# Patient Record
Sex: Female | Born: 1995 | Hispanic: Yes | Marital: Married | Smoking: Never smoker
Health system: Southern US, Community
[De-identification: ages and names within clinical notes are randomized; demographics above are authoritative.]

## PROBLEM LIST (undated history)

## (undated) ENCOUNTER — Inpatient Hospital Stay (HOSPITAL_COMMUNITY): Payer: Self-pay

## (undated) DIAGNOSIS — Z789 Other specified health status: Secondary | ICD-10-CM

## (undated) DIAGNOSIS — Z8759 Personal history of other complications of pregnancy, childbirth and the puerperium: Secondary | ICD-10-CM

## (undated) HISTORY — DX: Other specified health status: Z78.9

## (undated) HISTORY — DX: Personal history of other complications of pregnancy, childbirth and the puerperium: Z87.59

---
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
## 2018-06-21 NOTE — L&D Delivery Note (Signed)
   Delivery Note Pt progressed wo augmentation to C/C?+2.  After a brief 2nd stage, at 7:06 AM a viable female was delivered via Vaginal, Spontaneous (Presentation: ROA ).  APGAR: 7, 9; weight pending.  After 1 minute, the cord was clamped and cut. 40 units of pitocin diluted in 1000cc LR was infused rapidly IV.  The umbilical cord started tearing after 30 minutes, so it was manually removed.with very little effort. It was inspected and appears to be intact with a 3 VC.  Anesthesia: none   Episiotomy: None Lacerations: None Suture Repair:  Est. Blood Loss (mL):  200  Mom to postpartum.  Baby to Couplet care / Skin to Skin.   THe above was performed under my direct supervision and guidance by Luevenia Maxin, DO  Christin Fudge 03/30/2019, 7:44 AM

## 2018-08-29 ENCOUNTER — Inpatient Hospital Stay (HOSPITAL_COMMUNITY): Payer: Self-pay

## 2018-08-29 ENCOUNTER — Inpatient Hospital Stay (HOSPITAL_COMMUNITY)
Admission: AD | Admit: 2018-08-29 | Discharge: 2018-08-29 | Disposition: A | Discharge status: Home / Self Care | Payer: Self-pay | Attending: Family Medicine | Admitting: Family Medicine

## 2018-08-29 ENCOUNTER — Encounter (HOSPITAL_COMMUNITY): Payer: Self-pay | Admitting: *Deleted

## 2018-08-29 DIAGNOSIS — O208 Other hemorrhage in early pregnancy: Secondary | ICD-10-CM | POA: Insufficient documentation

## 2018-08-29 DIAGNOSIS — O26891 Other specified pregnancy related conditions, first trimester: Secondary | ICD-10-CM | POA: Insufficient documentation

## 2018-08-29 DIAGNOSIS — R109 Unspecified abdominal pain: Secondary | ICD-10-CM | POA: Insufficient documentation

## 2018-08-29 DIAGNOSIS — O209 Hemorrhage in early pregnancy, unspecified: Secondary | ICD-10-CM

## 2018-08-29 DIAGNOSIS — Z6740 Type O blood, Rh positive: Secondary | ICD-10-CM

## 2018-08-29 DIAGNOSIS — Z3490 Encounter for supervision of normal pregnancy, unspecified, unspecified trimester: Secondary | ICD-10-CM

## 2018-08-29 DIAGNOSIS — O360910 Maternal care for other rhesus isoimmunization, first trimester, not applicable or unspecified: Secondary | ICD-10-CM

## 2018-08-29 DIAGNOSIS — Z3A08 8 weeks gestation of pregnancy: Secondary | ICD-10-CM | POA: Insufficient documentation

## 2018-08-29 LAB — ABO/RH: ABO/RH(D): O POS

## 2018-08-29 LAB — URINALYSIS, ROUTINE W REFLEX MICROSCOPIC
BILIRUBIN URINE: NEGATIVE
Glucose, UA: NEGATIVE mg/dL
Ketones, ur: NEGATIVE mg/dL
Nitrite: NEGATIVE
PROTEIN: NEGATIVE mg/dL
Specific Gravity, Urine: 1.012 (ref 1.005–1.030)
pH: 6 (ref 5.0–8.0)

## 2018-08-29 LAB — CBC
HCT: 38.7 % (ref 36.0–46.0)
Hemoglobin: 12.9 g/dL (ref 12.0–15.0)
MCH: 28.4 pg (ref 26.0–34.0)
MCHC: 33.3 g/dL (ref 30.0–36.0)
MCV: 85.2 fL (ref 80.0–100.0)
Platelets: 275 10*3/uL (ref 150–400)
RBC: 4.54 MIL/uL (ref 3.87–5.11)
RDW: 12.5 % (ref 11.5–15.5)
WBC: 7.1 10*3/uL (ref 4.0–10.5)
nRBC: 0 % (ref 0.0–0.2)

## 2018-08-29 LAB — HCG, QUANTITATIVE, PREGNANCY: HCG, BETA CHAIN, QUANT, S: 68307 m[IU]/mL — AB (ref <5)

## 2018-08-29 MED ORDER — PROMETHAZINE HCL 25 MG PO TABS: 12.5000 mg | ORAL_TABLET | Freq: Four times a day (QID) | ORAL | 0 refills | Status: DC | PRN

## 2018-08-29 NOTE — MAU Note (Signed)
USING INTERPRETER- [employer] - [phone]   .   SAYS LMP WAS 07-03-18. NO BIRTH CONTROL. LAST SEX- Sunday.    HPT DONE - 08-10-18-POSITIVE.   BLEEDING/ CRAMPING   STARTED AT 600PM- TONIGHT - PINK- WHEN SHE WIPES AND 1 SPOT ON UNDERWEAR.

## 2018-08-29 NOTE — MAU Provider Note (Signed)
History     CSN: [id number]  Arrival date and time: 08/29/18 1845   None     Chief Complaint  Patient presents with  . Abdominal Pain  . Vaginal Bleeding   Amy Prince is a 23 y.o. G2P0 at [redacted]w[redacted]d who presents today with cramping and spotting.   Vaginal Bleeding  The patient's primary symptoms include pelvic pain and vaginal bleeding. This is a new problem. The current episode started yesterday. The problem occurs intermittently. The problem has been unchanged. The pain is moderate (4/10). The problem affects both sides. She is pregnant. Associated symptoms include nausea and vomiting. Pertinent negatives include no chills, dysuria, fever, frequency or urgency. The vaginal discharge was bloody. The vaginal bleeding is spotting. She has not been passing clots. She has not been passing tissue. Nothing aggravates the symptoms. She has tried nothing for the symptoms. Her menstrual history has been regular (LMP 07/03/18 ).    OB History    Gravida  2   Para      Term      Preterm      AB      Living  1     SAB      TAB      Ectopic      Multiple      Live Births  1           History reviewed. No pertinent past medical history.  History reviewed. No pertinent surgical history.  History reviewed. No pertinent family history.  Social History   Tobacco Use  . Smoking status: Not on file  Substance Use Topics  . Alcohol use: Not on file  . Drug use: Not on file    Allergies: No Known Allergies  Medications Prior to Admission  Medication Sig Dispense Refill Last Dose  . Prenatal Vit-Fe Fumarate-FA (PRENATAL MULTIVITAMIN) TABS tablet Take 1 tablet by mouth daily at 12 noon.   08/29/2018 at Unknown time    Review of Systems  Constitutional: Negative for chills and fever.  Gastrointestinal: Positive for nausea and vomiting.  Genitourinary: Positive for pelvic pain and vaginal bleeding. Negative for dysuria, frequency and urgency.   Physical Exam   Blood  pressure 103/63, pulse 78, temperature 98.5 F (36.9 C), temperature source Oral, resp. rate 20, weight 100.8 kg, last menstrual period 07/03/2018.  Physical Exam  Nursing note and vitals reviewed. Constitutional: She is oriented to person, place, and time. She appears well-developed and well-nourished. No distress.  HENT:  Head: Normocephalic.  Cardiovascular: Normal rate.  Respiratory: Effort normal.  GI: Soft. There is no abdominal tenderness.  Neurological: She is alert and oriented to person, place, and time.  Skin: Skin is warm and dry.  Psychiatric: She has a normal mood and affect.   Results for orders placed or performed during the hospital encounter of 08/29/18 (from the past 24 hour(s))  Urinalysis, Routine w reflex microscopic     Status: Abnormal   Collection Time: 08/29/18  7:53 PM  Result Value Ref Range   Color, Urine YELLOW YELLOW   APPearance CLOUDY (A) CLEAR   Specific Gravity, Urine 1.012 1.005 - 1.030   pH 6.0 5.0 - 8.0   Glucose, UA NEGATIVE NEGATIVE mg/dL   Hgb urine dipstick LARGE (A) NEGATIVE   Bilirubin Urine NEGATIVE NEGATIVE   Ketones, ur NEGATIVE NEGATIVE mg/dL   Protein, ur NEGATIVE NEGATIVE mg/dL   Nitrite NEGATIVE NEGATIVE   Leukocytes,Ua MODERATE (A) NEGATIVE   RBC / HPF 6-10 0 -  5 RBC/hpf   WBC, UA 21-50 0 - 5 WBC/hpf   Bacteria, UA RARE (A) NONE SEEN   Squamous Epithelial / LPF 11-20 0 - 5  CBC     Status: None   Collection Time: 08/29/18  8:38 PM  Result Value Ref Range   WBC 7.1 4.0 - 10.5 K/uL   RBC 4.54 3.87 - 5.11 MIL/uL   Hemoglobin 12.9 12.0 - 15.0 g/dL   HCT 16.1 09.6 - 04.5 %   MCV 85.2 80.0 - 100.0 fL   MCH 28.4 26.0 - 34.0 pg   MCHC 33.3 30.0 - 36.0 g/dL   RDW 40.9 81.1 - 91.4 %   Platelets 275 150 - 400 K/uL   nRBC 0.0 0.0 - 0.2 %  hCG, quantitative, pregnancy     Status: Abnormal   Collection Time: 08/29/18  8:38 PM  Result Value Ref Range   hCG, Beta Chain, Quant, S 68,307 (H) <5 mIU/mL  ABO/Rh     Status: None    Collection Time: 08/29/18  8:46 PM  Result Value Ref Range   ABO/RH(D) O POS    No rh immune globuloin      NOT A RH IMMUNE GLOBULIN CANDIDATE, PT RH POSITIVE Performed at [facility], [address], [place], [place] [address]    US Ob Comp Less 14 Wks  Result Date: 08/29/2018 CLINICAL DATA:  Vaginal bleeding and cramping. EXAM: OBSTETRIC <14 WK [place] AND TRANSVAGINAL OB US TECHNIQUE: Both transabdominal and transvaginal ultrasound examinations were performed for complete evaluation of the gestation as well as the maternal uterus, adnexal regions, and pelvic cul-de-sac. Transvaginal technique was performed to assess early pregnancy. COMPARISON:  None. FINDINGS: Intrauterine gestational sac: None Yolk sac:  Visualized. Embryo:  Visualized. Cardiac Activity: Visualized. Heart Rate: 174 bpm CRL:  19.4 mm   8 w   3 d                  [place] EDC: 04/06/2019 Subchorionic hemorrhage:  Small, measuring 1.5 by 1.0 by 2.7 cm. Maternal uterus/adnexae: Normal. IMPRESSION: Single live intrauterine pregnancy corresponding to 8 weeks and 3 days gestation. Small subchorionic hemorrhage. Electronically Signed   By: Ted Mcalpine M.D.   On: 08/29/2018 21:12   US Ob Transvaginal  Result Date: 08/29/2018 CLINICAL DATA:  Vaginal bleeding and cramping. EXAM: OBSTETRIC <14 WK [place] AND TRANSVAGINAL OB US TECHNIQUE: Both transabdominal and transvaginal ultrasound examinations were performed for complete evaluation of the gestation as well as the maternal uterus, adnexal regions, and pelvic cul-de-sac. Transvaginal technique was performed to assess early pregnancy. COMPARISON:  None. FINDINGS: Intrauterine gestational sac: None Yolk sac:  Visualized. Embryo:  Visualized. Cardiac Activity: Visualized. Heart Rate: 174 bpm CRL:  19.4 mm   8 w   3 d                  [place] EDC: 04/06/2019 Subchorionic hemorrhage:  Small, measuring 1.5 by 1.0 by 2.7 cm. Maternal uterus/adnexae: Normal. IMPRESSION: Single live intrauterine pregnancy  corresponding to 8 weeks and 3 days gestation. Small subchorionic hemorrhage. Electronically Signed   By: Ted Mcalpine M.D.   On: 08/29/2018 21:12     MAU Course  Procedures  MDM   Assessment and Plan   1. Vaginal bleeding in pregnancy, first trimester   2. [redacted] weeks gestation of pregnancy   3. Intrauterine pregnancy   4. Type O blood, Rh positive    DC home Comfort measures reviewed  1stTrimester precautions  Bleeding precautions RX: phenergan PRN #[id number]  Return to MAU as needed FU with OB as planned  Follow-up Information    Department, [facility] Follow up.   Contact information: [address] [place] [address] [phone]           Thressa Sheller DNP, CNM  08/29/18  9:48 PM

## 2018-08-29 NOTE — Discharge Instructions (Signed)
[facility] Prenatal Care Providers °         °[facility]  ° Phone: [phone] ° °[facility] @ [facility]  ° Phone: [phone] ° °[facility]      ° Phone: [phone]   °         °[facility] @ [facility]    ° Phone: [phone] °         °[facility] @ [facility]  ° Phone: [phone] ° °[facility] @ [facility] ° Phone: [phone] °    °[facility] ° Phone: [phone] ° °

## 2018-08-29 NOTE — MAU Note (Signed)
WENT TO HD ON 08-03-18- POSITIVE UPT.

## 2018-08-30 LAB — RPR: RPR Ser Ql: NONREACTIVE

## 2018-08-30 LAB — HIV ANTIBODY (ROUTINE TESTING W REFLEX): HIV Screen 4th Generation wRfx: NONREACTIVE

## 2018-10-20 LAB — OB RESULTS CONSOLE VARICELLA ZOSTER ANTIBODY, IGG: Varicella: IMMUNE

## 2018-10-20 LAB — CYSTIC FIBROSIS DIAGNOSTIC STUDY: Interpretation-CFDNA:: NEGATIVE

## 2018-10-20 LAB — SICKLE CELL SCREEN: Sickle Cell Screen: NORMAL

## 2018-11-08 ENCOUNTER — Ambulatory Visit (INDEPENDENT_AMBULATORY_CARE_PROVIDER_SITE_OTHER): Payer: Self-pay | Admitting: Obstetrics and Gynecology

## 2018-11-08 ENCOUNTER — Encounter: Payer: Self-pay | Admitting: Obstetrics and Gynecology

## 2018-11-08 ENCOUNTER — Other Ambulatory Visit: Payer: Self-pay

## 2018-11-08 VITALS — BP 102/67 | HR 72 | Temp 98.6°F | Ht 66.0 in | Wt 219.0 lb

## 2018-11-08 DIAGNOSIS — O09899 Supervision of other high risk pregnancies, unspecified trimester: Secondary | ICD-10-CM

## 2018-11-08 DIAGNOSIS — Z3A18 18 weeks gestation of pregnancy: Secondary | ICD-10-CM

## 2018-11-08 DIAGNOSIS — O0990 Supervision of high risk pregnancy, unspecified, unspecified trimester: Secondary | ICD-10-CM

## 2018-11-08 DIAGNOSIS — O09292 Supervision of pregnancy with other poor reproductive or obstetric history, second trimester: Secondary | ICD-10-CM

## 2018-11-08 DIAGNOSIS — O09299 Supervision of pregnancy with other poor reproductive or obstetric history, unspecified trimester: Secondary | ICD-10-CM

## 2018-11-08 DIAGNOSIS — Z789 Other specified health status: Secondary | ICD-10-CM | POA: Insufficient documentation

## 2018-11-08 DIAGNOSIS — O09212 Supervision of pregnancy with history of pre-term labor, second trimester: Secondary | ICD-10-CM

## 2018-11-08 DIAGNOSIS — O0992 Supervision of high risk pregnancy, unspecified, second trimester: Secondary | ICD-10-CM

## 2018-11-08 DIAGNOSIS — Z758 Other problems related to medical facilities and other health care: Secondary | ICD-10-CM

## 2018-11-08 NOTE — Progress Notes (Addendum)
INITIAL PRENATAL VISIT NOTE  Subjective:  Amy Prince is a 23 y.o. G2P1011 at 6070w2d by LMP c/w 8 week US being seen today for her initial prenatal visit. [facility]. This is a planned pregnancy. She and partner are happy with the pregnancy. She was using nothing for birth control previously. She has an obstetric history significant for 1 x SVD @ 36 weeks, induced for severe preeclampsia and SAB at 12 weeks requiring pills to complete. She has a medical history significant for severe preeclampsia.  Patient reports no complaints.  Contractions: Not present. Vag. Bleeding: None.  Movement: Present. Denies leaking of fluid.    History reviewed. No pertinent past medical history.  Past Surgical History:  Procedure Laterality Date  . CHOLECYSTECTOMY OPEN  2013    OB History  Gravida Para Term Preterm AB Living  3 1   1 1 1   SAB TAB Ectopic Multiple Live Births  1       1    # Outcome Date GA Lbr Len/2nd Weight Sex Delivery Anes PTL Lv  3 Current           2 SAB  6743w0d         1 Preterm  6927w0d    Vag-Spont        Birth Comments: induced at 36 weeks for preeclampsia    Social History   Socioeconomic History  . Marital status: Legally Separated    Spouse name: Not on file  . Number of children: Not on file  . Years of education: Not on file  . Highest education level: Not on file  Occupational History  . Not on file  Social Needs  . Financial resource strain: Not on file  . Food insecurity:    Worry: Not on file    Inability: Not on file  . Transportation needs:    Medical: Not on file    Non-medical: Not on file  Tobacco Use  . Smoking status: Never Smoker  . Smokeless tobacco: Never Used  Substance and Sexual Activity  . Alcohol use: Not on file  . Drug use: Not on file  . Sexual activity: Not on file  Lifestyle  . Physical activity:    Days per week: Not on file    Minutes per session: Not on file  . Stress: Not on file   Relationships  . Social connections:    Talks on phone: Patient refused    Gets together: Patient refused    Attends religious service: Patient refused    Active member of club or organization: Patient refused    Attends meetings of clubs or organizations: Patient refused    Relationship status: Patient refused  Other Topics Concern  . Not on file  Social History Narrative  . Not on file    Family History  Problem Relation Age of Onset  . Kidney disease Father     Current Outpatient Medications:  .  aspirin 81 MG chewable tablet, Chew 81 mg by mouth daily., Disp: , Rfl:  .  Prenatal Vit-Fe Fumarate-FA (PRENATAL MULTIVITAMIN) TABS tablet, Take 1 tablet by mouth daily at 12 noon., Disp: , Rfl:  .  promethazine (PHENERGAN) 25 MG tablet, Take 0.5-1 tablets (12.5-25 mg total) by mouth every 6 (six) hours as needed for nausea or vomiting., Disp: 30 tablet, Rfl: 0  No Known Allergies  Review of Systems: Negative except for what is mentioned in HPI.  Objective:   Vitals:  11/08/18 0838 11/08/18 0844  BP: 102/67   Pulse: 72   Temp: 98.6 F (37 C)   Weight: 219 lb (99.3 kg)   Height:  5\' 6"  (1.676 m)    Fetal Status: Fetal Heart Rate (bpm): 158   Movement: Present     Physical Exam: BP 102/67   Pulse 72   Temp 98.6 F (37 C)   Ht 5\' 6"  (1.676 m)   Wt 219 lb (99.3 kg)   LMP 07/03/2018   BMI 35.35 kg/m  CONSTITUTIONAL: Well-developed, well-nourished female in no acute distress.  NEUROLOGIC: Alert and oriented to person, place, and time. Normal reflexes, muscle tone coordination. No cranial nerve deficit noted. PSYCHIATRIC: Normal mood and affect. Normal behavior. Normal judgment and thought content. SKIN: Skin is warm and dry. No rash noted. Not diaphoretic. No erythema. No pallor. HENT:  Normocephalic, atraumatic, External right and left ear normal. Oropharynx is clear and moist EYES: Conjunctivae and EOM are normal. Pupils are equal, round, and reactive to light. No  scleral icterus.  NECK: Normal range of motion, supple, no masses CARDIOVASCULAR: Normal heart rate noted RESPIRATORY: Effort normal, no problems with respiration noted BREASTS: deferred ABDOMEN: Soft, nontender, nondistended, gravid. GU: deferred MUSCULOSKELETAL: Normal range of motion. EXT:  No edema and no tenderness. 2+ distal pulses.   Assessment and Plan:  Pregnancy: G3P1011 at [redacted]w[redacted]d by LMP  1. H/O preterm delivery, currently pregnant Induced @ 36 weeks for pre-eclampsia, went home on anti-hypertensives x 2 months, none currently, Makena not indicated - [place] MFM OB COMP + 14 WK; Future  2. H/O pre-eclampsia in prior pregnancy, currently pregnant Cont baby ASA, reviewed importance of taking baby ASA throughout entire pregnancy BP stable  3. Supervision of high risk pregnancy in second trimester MFM [place] ordered today Pregnancy history fully reviewed - pap done 05/2017, need to get records from [facility]  5. Language barrier Engineer, structural used   Preterm labor symptoms and general obstetric precautions including but not limited to vaginal bleeding, contractions, leaking of fluid and fetal movement were reviewed in detail with the patient.  Please refer to After Visit Summary for other counseling recommendations.   Return in about 2 weeks (around 11/22/2018) for OB visit (MD), virtual.  Conan Bowens 11/08/2018 9:21 AM

## 2018-11-14 ENCOUNTER — Encounter: Payer: Self-pay | Admitting: *Deleted

## 2018-11-16 ENCOUNTER — Ambulatory Visit (HOSPITAL_COMMUNITY)
Admission: RE | Admit: 2018-11-16 | Discharge: 2018-11-16 | Disposition: A | Discharge status: Home / Self Care | Payer: Self-pay | Source: Ambulatory Visit | Attending: Obstetrics and Gynecology | Admitting: Obstetrics and Gynecology

## 2018-11-16 ENCOUNTER — Other Ambulatory Visit (HOSPITAL_COMMUNITY): Payer: Self-pay | Admitting: *Deleted

## 2018-11-16 ENCOUNTER — Other Ambulatory Visit: Payer: Self-pay

## 2018-11-16 DIAGNOSIS — O09219 Supervision of pregnancy with history of pre-term labor, unspecified trimester: Secondary | ICD-10-CM | POA: Insufficient documentation

## 2018-11-16 DIAGNOSIS — Z3A19 19 weeks gestation of pregnancy: Secondary | ICD-10-CM

## 2018-11-16 DIAGNOSIS — Z363 Encounter for antenatal screening for malformations: Secondary | ICD-10-CM

## 2018-11-16 DIAGNOSIS — O09899 Supervision of other high risk pregnancies, unspecified trimester: Secondary | ICD-10-CM

## 2018-11-16 DIAGNOSIS — Z362 Encounter for other antenatal screening follow-up: Secondary | ICD-10-CM

## 2018-11-23 ENCOUNTER — Telehealth (INDEPENDENT_AMBULATORY_CARE_PROVIDER_SITE_OTHER): Payer: Self-pay | Admitting: Obstetrics & Gynecology

## 2018-11-23 ENCOUNTER — Other Ambulatory Visit: Payer: Self-pay

## 2018-11-23 VITALS — BP 112/71 | HR 66

## 2018-11-23 DIAGNOSIS — Z3A20 20 weeks gestation of pregnancy: Secondary | ICD-10-CM

## 2018-11-23 DIAGNOSIS — O09292 Supervision of pregnancy with other poor reproductive or obstetric history, second trimester: Secondary | ICD-10-CM

## 2018-11-23 DIAGNOSIS — O99210 Obesity complicating pregnancy, unspecified trimester: Secondary | ICD-10-CM | POA: Insufficient documentation

## 2018-11-23 DIAGNOSIS — O99212 Obesity complicating pregnancy, second trimester: Secondary | ICD-10-CM

## 2018-11-23 DIAGNOSIS — O09899 Supervision of other high risk pregnancies, unspecified trimester: Secondary | ICD-10-CM

## 2018-11-23 DIAGNOSIS — O09212 Supervision of pregnancy with history of pre-term labor, second trimester: Secondary | ICD-10-CM

## 2018-11-23 DIAGNOSIS — O09299 Supervision of pregnancy with other poor reproductive or obstetric history, unspecified trimester: Secondary | ICD-10-CM

## 2018-11-23 DIAGNOSIS — O0990 Supervision of high risk pregnancy, unspecified, unspecified trimester: Secondary | ICD-10-CM

## 2018-11-23 DIAGNOSIS — Z789 Other specified health status: Secondary | ICD-10-CM

## 2018-11-23 NOTE — Progress Notes (Signed)
   TELEHEALTH VIRTUAL OBSTETRICS VISIT ENCOUNTER NOTE  I connected with Amy Prince on 11/23/18 at  9:35 AM EDT by telephone at home and verified that I am speaking with the correct person using two identifiers.   I discussed the limitations, risks, security and privacy concerns of performing an evaluation and management service by telephone and the availability of in person appointments. I also discussed with the patient that there may be a patient responsible charge related to this service. The patient expressed understanding and agreed to proceed.  Subjective:  Amy Prince is a 23 y.o. [phone] at [redacted]w[redacted]d being followed for ongoing prenatal care.  She is currently monitored for the following issues for this high-risk pregnancy and has Supervision of high risk pregnancy, antepartum; H/O pre-eclampsia in prior pregnancy, currently pregnant; H/O preterm delivery, currently pregnant; and Language barrier on their problem list.  Patient reports no complaints. Reports fetal movement. Denies any contractions, bleeding or leaking of fluid.   The following portions of the patient's history were reviewed and updated as appropriate: allergies, current medications, past family history, past medical history, past social history, past surgical history and problem list.   Objective:   General:  Alert, oriented and cooperative.   Mental Status: Normal mood and affect perceived. Normal judgment and thought content.  Rest of physical exam deferred due to type of encounter  Assessment and Plan:  Pregnancy: [id number] at [redacted]w[redacted]d 1. H/O preterm delivery, currently pregnant - her delivery was at 36 weeks for Pre eclampsia  2. Supervision of high risk pregnancy, antepartum -I have encouraged her to buy or borrow a scale so that she can weight herself weekly  3. Language barrier - [place] interpretor used for the visit  Preterm labor symptoms and general obstetric precautions including but not limited to  vaginal bleeding, contractions, leaking of fluid and fetal movement were reviewed in detail with the patient.  I discussed the assessment and treatment plan with the patient. The patient was provided an opportunity to ask questions and all were answered. The patient agreed with the plan and demonstrated an understanding of the instructions. The patient was advised to call back or seek an in-person office evaluation/go to MAU at [facility] for any urgent or concerning symptoms. Please refer to After Visit Summary for other counseling recommendations.   I provided 10 minutes of non-face-to-face time during this encounter.  No follow-ups on file.  Future Appointments  Date Time Provider Department Center  12/15/2018 11:30 AM [facility] NURSE WH-M[facility] MFC-US  12/15/2018 11:30 AM WH-M[facility] [place] 5 WH-MFCUS MFC-US    Allie Bossier, MD Center for [employer], [facility]

## 2018-12-15 ENCOUNTER — Other Ambulatory Visit (HOSPITAL_COMMUNITY): Payer: Self-pay | Admitting: *Deleted

## 2018-12-15 ENCOUNTER — Ambulatory Visit (HOSPITAL_COMMUNITY)
Admission: RE | Admit: 2018-12-15 | Discharge: 2018-12-15 | Disposition: A | Discharge status: Home / Self Care | Payer: Self-pay | Source: Ambulatory Visit | Attending: Obstetrics and Gynecology | Admitting: Obstetrics and Gynecology

## 2018-12-15 ENCOUNTER — Other Ambulatory Visit: Payer: Self-pay

## 2018-12-15 ENCOUNTER — Ambulatory Visit (HOSPITAL_COMMUNITY): Payer: Self-pay | Admitting: *Deleted

## 2018-12-15 ENCOUNTER — Encounter (HOSPITAL_COMMUNITY): Payer: Self-pay | Admitting: *Deleted

## 2018-12-15 DIAGNOSIS — O99212 Obesity complicating pregnancy, second trimester: Secondary | ICD-10-CM

## 2018-12-15 DIAGNOSIS — Z362 Encounter for other antenatal screening follow-up: Secondary | ICD-10-CM

## 2018-12-15 DIAGNOSIS — Z3689 Encounter for other specified antenatal screening: Secondary | ICD-10-CM

## 2018-12-15 DIAGNOSIS — O09292 Supervision of pregnancy with other poor reproductive or obstetric history, second trimester: Secondary | ICD-10-CM

## 2018-12-15 DIAGNOSIS — O99210 Obesity complicating pregnancy, unspecified trimester: Secondary | ICD-10-CM

## 2018-12-15 DIAGNOSIS — O0990 Supervision of high risk pregnancy, unspecified, unspecified trimester: Secondary | ICD-10-CM

## 2018-12-15 DIAGNOSIS — Z3A23 23 weeks gestation of pregnancy: Secondary | ICD-10-CM

## 2018-12-15 DIAGNOSIS — O09212 Supervision of pregnancy with history of pre-term labor, second trimester: Secondary | ICD-10-CM

## 2018-12-21 ENCOUNTER — Telehealth: Payer: Self-pay | Admitting: Obstetrics and Gynecology

## 2018-12-21 ENCOUNTER — Encounter: Payer: Self-pay | Admitting: Obstetrics and Gynecology

## 2018-12-21 ENCOUNTER — Ambulatory Visit (INDEPENDENT_AMBULATORY_CARE_PROVIDER_SITE_OTHER): Payer: Self-pay | Admitting: Obstetrics and Gynecology

## 2018-12-21 ENCOUNTER — Other Ambulatory Visit: Payer: Self-pay

## 2018-12-21 VITALS — BP 109/77 | HR 68

## 2018-12-21 DIAGNOSIS — Z789 Other specified health status: Secondary | ICD-10-CM

## 2018-12-21 DIAGNOSIS — O0990 Supervision of high risk pregnancy, unspecified, unspecified trimester: Secondary | ICD-10-CM

## 2018-12-21 DIAGNOSIS — O0992 Supervision of high risk pregnancy, unspecified, second trimester: Secondary | ICD-10-CM

## 2018-12-21 DIAGNOSIS — O09212 Supervision of pregnancy with history of pre-term labor, second trimester: Secondary | ICD-10-CM

## 2018-12-21 DIAGNOSIS — O09899 Supervision of other high risk pregnancies, unspecified trimester: Secondary | ICD-10-CM

## 2018-12-21 DIAGNOSIS — Z3A24 24 weeks gestation of pregnancy: Secondary | ICD-10-CM

## 2018-12-21 DIAGNOSIS — O09299 Supervision of pregnancy with other poor reproductive or obstetric history, unspecified trimester: Secondary | ICD-10-CM

## 2018-12-21 DIAGNOSIS — O09292 Supervision of pregnancy with other poor reproductive or obstetric history, second trimester: Secondary | ICD-10-CM

## 2018-12-21 MED ORDER — PRENATAL MULTIVITAMIN CH: 1.0000 | ORAL_TABLET | Freq: Every day | ORAL | 11 refills | Status: AC

## 2018-12-21 NOTE — Progress Notes (Signed)
TELEHEALTH OBSTETRICS PRENATAL VIRTUAL VIDEO VISIT ENCOUNTER NOTE  Provider location: [facility] [place]   I connected with Amy Prince on 12/21/18 at  8:15 AM EDT by WebExt Video Encounter at home and verified that I am speaking with the correct person using two identifiers.   I discussed the limitations, risks, security and privacy concerns of performing an evaluation and management service by telephone and the availability of in person appointments. I also discussed with the patient that there may be a patient responsible charge related to this service. The patient expressed understanding and agreed to proceed. Subjective:  Amy Prince is a 23 y.o. [phone] at 59w3dbeing seen today for ongoing prenatal care.  She is currently monitored for the following issues for this high-risk pregnancy and has Supervision of high risk pregnancy, antepartum; H/O pre-eclampsia in prior pregnancy, currently pregnant; H/O preterm delivery, currently pregnant; Language barrier; and Obesity in pregnancy on their problem list.  Patient reports no complaints.  Contractions: Not present. Vag. Bleeding: None.  Movement: Present. Denies any leaking of fluid.   The following portions of the patient's history were reviewed and updated as appropriate: allergies, current medications, past family history, past medical history, past social history, past surgical history and problem list.   Objective:   Vitals:   12/21/18 0834  BP: 109/77  Pulse: 68    Fetal Status:     Movement: Present     General:  Alert, oriented and cooperative. Patient is in no acute distress.  Respiratory: Normal respiratory effort, no problems with respiration noted  Mental Status: Normal mood and affect. Normal behavior. Normal judgment and thought content.  Rest of physical exam deferred due to type of encounter  Imaging: [place]fm Ob Follow Up  Result Date: 12/15/2018  ----------------------------------------------------------------------  OBSTETRICS REPORT                       (Signed Final 12/15/2018 04:23 pm) ---------------------------------------------------------------------- Patient Info  ID #:       [id number]                         D.O.B.:  [birth year](22 yrs)  Name:       AGeoffry Prince                Visit Date: 12/15/2018 12:17 pm ---------------------------------------------------------------------- Performed By  Performed By:     KJeanene ErbBS,      Ref. Address:     [address] Attending:        CSander Nephew     [facility]                    MD  Fetal Care  Referred By:      Sloan Leiter                    MD ---------------------------------------------------------------------- Orders   #  Description                          Code         Ordered By   1  [place] MFM OB FOLLOW UP                  80881.10     [place]  ----------------------------------------------------------------------   #  Order #                    Accession #                 Episode #   1  [id number]                  [id number]                  [id number]  ---------------------------------------------------------------------- Indications   [redacted] weeks gestation of pregnancy                Z3A.23   History of pre-term deliveries '@36weeks'$         O09.219   Poor obstetric history: Previous               O09.299   preeclampsia / eclampsia/gestational HTN   (ASA)   Encounter for other antenatal screening        Z36.2   follow-up   Obesity complicating pregnancy, second         O99.212   trimester  ---------------------------------------------------------------------- Vital Signs  Weight (lb): 219                               Height:        5'6"  BMI:         35.34 ----------------------------------------------------------------------  Fetal Evaluation  Num Of Fetuses:         1  Fetal Heart Rate(bpm):  144  Cardiac Activity:       Observed  Presentation:           Cephalic  Placenta:               Posterior  P. Cord Insertion:      Previously Visualized  Amniotic Fluid  AFI FV:      Within normal limits                              Largest Pocket(cm)                              5.4 ---------------------------------------------------------------------- Biometry  BPD:      62.4  mm     G. Age:  25w 2d         89  %    CI:        76.88   %    70 - 86  FL/HC:      19.8   %    18.7 - 20.9  HC:      225.4  mm     G. Age:  24w 4d         60  %    HC/AC:      1.04        1.05 - 1.21  AC:      217.6  mm     G. Age:  26w 2d         96  %    FL/BPD:     71.6   %    71 - 87  FL:       44.7  mm     G. Age:  24w 5d         67  %    FL/AC:      20.5   %    20 - 24  Est. FW:     816  gm    1 lb 13 oz      97  % ---------------------------------------------------------------------- OB History  Gravidity:    3         Prem:   1         SAB:   1  Living:       1 ---------------------------------------------------------------------- Gestational Age  LMP:           23w 4d        Date:  07/03/18                 EDD:   04/09/19  U/S Today:     25w 2d                                        EDD:   03/28/19  Best:          23w 6d     Det. By:  Previous Ultrasound      EDD:   04/07/19                                      (08/29/18) ---------------------------------------------------------------------- Anatomy  Cranium:               Appears normal         Aortic Arch:            Appears normal  Cavum:                 Appears normal         Ductal Arch:            Previously seen  Ventricles:            Appears normal         Diaphragm:              Appears normal  Choroid Plexus:        Appears normal         Stomach:                Appears normal, left  sided  Cerebellum:            Appears normal         Abdomen:                Appears normal  Posterior Fossa:       Appears normal         Abdominal Wall:         Appears nml (cord                                                                        insert, abd wall)  Nuchal Fold:           Previously seen        Cord Vessels:           Appears normal (3                                                                        vessel cord)  Face:                  Orbits and profile     Kidneys:                Appear normal                         previously seen  Lips:                  Appears normal         Bladder:                Appears normal  Thoracic:              Appears normal         Spine:                  Appears normal  Heart:                 Appears normal         Upper Extremities:      Previously seen                         (4CH, axis, and                         situs)  RVOT:                  Previously seen        Lower Extremities:      Previously seen  LVOT:                  Previously seen  Other:  Lt Heel, 5th digit, and Nasal bone previously visualized. Female gender. ---------------------------------------------------------------------- Cervix Uterus Adnexa  Cervix  Not visualized (advanced GA >24wks) ---------------------------------------------------------------------- Impression  Normal interval growth. ----------------------------------------------------------------------  Recommendations  Follow up growth in 7 weeks. ----------------------------------------------------------------------               Sander Nephew, MD Electronically Signed Final Report   12/15/2018 04:23 pm ----------------------------------------------------------------------   Assessment and Plan:  Pregnancy: O1Y0737 at 35w3d1. Supervision of high risk pregnancy, antepartum Stable Glucola next visit  2. H/O pre-eclampsia in prior pregnancy, currently pregnant Labs below to be drawn on next OB visit -  Comp Met (CMET) - Protein / creatinine ratio, urine  3. H/O preterm delivery, currently pregnant IOL d/t SPEC  4. Language barrier Live interrupter used during today's visit  Preterm labor symptoms and general obstetric precautions including but not limited to vaginal bleeding, contractions, leaking of fluid and fetal movement were reviewed in detail with the patient. I discussed the assessment and treatment plan with the patient. The patient was provided an opportunity to ask questions and all were answered. The patient agreed with the plan and demonstrated an understanding of the instructions. The patient was advised to call back or seek an in-person office evaluation/[facility]. Please refer to After Visit Summary for other counseling recommendations.   I provided 8 minutes of face-to-face time during this encounter.  Return in about 4 weeks (around 01/18/2019) for OB visit, face to face for glucola.  Future Appointments  Date Time Provider [place] 02[facility]/2019 12:30 [facility]  02/13/2019 12:30 PM [email or web address] [facility]-MFC[facility] M[facility]    MChancy Milroy MWest [place]for [facility] [place]

## 2018-12-21 NOTE — Telephone Encounter (Signed)
Attempted to call patient w/ [facility] Spanish interpreter with her next ob appointment. No answer, left voicemail with appointment 7/31 @ 8:20. Appointment reminder mailed.

## 2018-12-21 NOTE — Patient Instructions (Signed)
Third Trimester of Pregnancy The third trimester is from week 28 through week 40 (months 7 through 9). The third trimester is a time when the unborn baby (fetus) is growing rapidly. At the end of the ninth month, the fetus is about 20 inches in length and weighs 6-10 pounds. Body changes during your third trimester Your body will continue to go through many changes during pregnancy. The changes vary from woman to woman. During the third trimester:  Your weight will continue to increase. You can expect to gain 25-35 pounds (11-16 kg) by the end of the pregnancy.  You may begin to get stretch marks on your hips, abdomen, and breasts.  You may urinate more often because the fetus is moving lower into your pelvis and pressing on your bladder.  You may develop or continue to have heartburn. This is caused by increased hormones that slow down muscles in the digestive tract.  You may develop or continue to have constipation because increased hormones slow digestion and cause the muscles that push waste through your intestines to relax.  You may develop hemorrhoids. These are swollen veins (varicose veins) in the rectum that can itch or be painful.  You may develop swollen, bulging veins (varicose veins) in your legs.  You may have increased body aches in the pelvis, back, or thighs. This is due to weight gain and increased hormones that are relaxing your joints.  You may have changes in your hair. These can include thickening of your hair, rapid growth, and changes in texture. Some women also have hair loss during or after pregnancy, or hair that feels dry or thin. Your hair will most likely return to normal after your baby is born.  Your breasts will continue to grow and they will continue to become tender. A yellow fluid (colostrum) may leak from your breasts. This is the first milk you are producing for your baby.  Your belly button may stick out.  You may notice more swelling in your hands,  face, or ankles.  You may have increased tingling or numbness in your hands, arms, and legs. The skin on your belly may also feel numb.  You may feel short of breath because of your expanding uterus.  You may have more problems sleeping. This can be caused by the size of your belly, increased need to urinate, and an increase in your body's metabolism.  You may notice the fetus "dropping," or moving lower in your abdomen (lightening).  You may have increased vaginal discharge.  You may notice your joints feel loose and you may have pain around your pelvic bone. What to expect at prenatal visits You will have prenatal exams every 2 weeks until week 36. Then you will have weekly prenatal exams. During a routine prenatal visit:  You will be weighed to make sure you and the baby are growing normally.  Your blood pressure will be taken.  Your abdomen will be measured to track your baby's growth.  The fetal heartbeat will be listened to.  Any test results from the previous visit will be discussed.  You may have a cervical check near your due date to see if your cervix has softened or thinned (effaced).  You will be tested for Group B streptococcus. This happens between 35 and 37 weeks. Your health care provider may ask you:  What your birth plan is.  How you are feeling.  If you are feeling the baby move.  If you have had any abnormal   symptoms, such as leaking fluid, bleeding, severe headaches, or abdominal cramping.  If you are using any tobacco products, including cigarettes, chewing tobacco, and electronic cigarettes.  If you have any questions. Other tests or screenings that may be performed during your third trimester include:  Blood tests that check for low iron levels (anemia).  Fetal testing to check the health, activity level, and growth of the fetus. Testing is done if you have certain medical conditions or if there are problems during the pregnancy.  Nonstress test  (NST). This test checks the health of your baby to make sure there are no signs of problems, such as the baby not getting enough oxygen. During this test, a belt is placed around your belly. The baby is made to move, and its heart rate is monitored during movement. What is false labor? False labor is a condition in which you feel small, irregular tightenings of the muscles in the womb (contractions) that usually go away with rest, changing position, or drinking water. These are called Braxton Hicks contractions. Contractions may last for hours, days, or even weeks before true labor sets in. If contractions come at regular intervals, become more frequent, increase in intensity, or become painful, you should see your health care provider. What are the signs of labor?  Abdominal cramps.  Regular contractions that start at 10 minutes apart and become stronger and more frequent with time.  Contractions that start on the top of the uterus and spread down to the lower abdomen and back.  Increased pelvic pressure and dull back pain.  A watery or bloody mucus discharge that comes from the vagina.  Leaking of amniotic fluid. This is also known as your "water breaking." It could be a slow trickle or a gush. Let your health care provider know if it has a color or strange odor. If you have any of these signs, call your health care provider right away, even if it is before your due date. Follow these instructions at home: Medicines  Follow your health care provider's instructions regarding medicine use. Specific medicines may be either safe or unsafe to take during pregnancy.  Take a prenatal vitamin that contains at least 600 micrograms (mcg) of folic acid.  If you develop constipation, try taking a stool softener if your health care provider approves. Eating and drinking   Eat a balanced diet that includes fresh fruits and vegetables, whole grains, good sources of protein such as meat, eggs, or tofu,  and low-fat dairy. Your health care provider will help you determine the amount of weight gain that is right for you.  Avoid raw meat and uncooked cheese. These carry germs that can cause birth defects in the baby.  If you have low calcium intake from food, talk to your health care provider about whether you should take a daily calcium supplement.  Eat four or five small meals rather than three large meals a day.  Limit foods that are high in fat and processed sugars, such as fried and sweet foods.  To prevent constipation: ? Drink enough fluid to keep your urine clear or pale yellow. ? Eat foods that are high in fiber, such as fresh fruits and vegetables, whole grains, and beans. Activity  Exercise only as directed by your health care provider. Most women can continue their usual exercise routine during pregnancy. Try to exercise for 30 minutes at least 5 days a week. Stop exercising if you experience uterine contractions.  Avoid heavy lifting.  Do   not exercise in extreme heat or humidity, or at high altitudes.  Wear low-heel, comfortable shoes.  Practice good posture.  You may continue to have sex unless your health care provider tells you otherwise. Relieving pain and discomfort  Take frequent breaks and rest with your legs elevated if you have leg cramps or low back pain.  Take warm sitz baths to soothe any pain or discomfort caused by hemorrhoids. Use hemorrhoid cream if your health care provider approves.  Wear a good support bra to prevent discomfort from breast tenderness.  If you develop varicose veins: ? Wear support pantyhose or compression stockings as told by your healthcare provider. ? Elevate your feet for 15 minutes, 3-4 times a day. Prenatal care  Write down your questions. Take them to your prenatal visits.  Keep all your prenatal visits as told by your health care provider. This is important. Safety  Wear your seat belt at all times when driving.  Make  a list of emergency phone numbers, including numbers for family, friends, the hospital, and police and fire departments. General instructions  Avoid cat litter boxes and soil used by cats. These carry germs that can cause birth defects in the baby. If you have a cat, ask someone to clean the litter box for you.  Do not travel far distances unless it is absolutely necessary and only with the approval of your health care provider.  Do not use hot tubs, steam rooms, or saunas.  Do not drink alcohol.  Do not use any products that contain nicotine or tobacco, such as cigarettes and e-cigarettes. If you need help quitting, ask your health care provider.  Do not use any medicinal herbs or unprescribed drugs. These chemicals affect the formation and growth of the baby.  Do not douche or use tampons or scented sanitary pads.  Do not cross your legs for long periods of time.  To prepare for the arrival of your baby: ? Take prenatal classes to understand, practice, and ask questions about labor and delivery. ? Make a trial run to the hospital. ? Visit the hospital and tour the maternity area. ? Arrange for maternity or paternity leave through employers. ? Arrange for family and friends to take care of pets while you are in the hospital. ? Purchase a rear-facing car seat and make sure you know how to install it in your car. ? Pack your hospital bag. ? Prepare the baby's nursery. Make sure to remove all pillows and stuffed animals from the baby's crib to prevent suffocation.  Visit your dentist if you have not gone during your pregnancy. Use a soft toothbrush to brush your teeth and be gentle when you floss. Contact a health care provider if:  You are unsure if you are in labor or if your water has broken.  You become dizzy.  You have mild pelvic cramps, pelvic pressure, or nagging pain in your abdominal area.  You have lower back pain.  You have persistent nausea, vomiting, or diarrhea.   You have an unusual or bad smelling vaginal discharge.  You have pain when you urinate. Get help right away if:  Your water breaks before 37 weeks.  You have regular contractions less than 5 minutes apart before 37 weeks.  You have a fever.  You are leaking fluid from your vagina.  You have spotting or bleeding from your vagina.  You have severe abdominal pain or cramping.  You have rapid weight loss or weight gain.  You have   shortness of breath with chest pain.  You notice sudden or extreme swelling of your face, hands, ankles, feet, or legs.  Your baby makes fewer than 10 movements in 2 hours.  You have severe headaches that do not go away when you take medicine.  You have vision changes. Summary  The third trimester is from week 28 through week 40, months 7 through 9. The third trimester is a time when the unborn baby (fetus) is growing rapidly.  During the third trimester, your discomfort may increase as you and your baby continue to gain weight. You may have abdominal, leg, and back pain, sleeping problems, and an increased need to urinate.  During the third trimester your breasts will keep growing and they will continue to become tender. A yellow fluid (colostrum) may leak from your breasts. This is the first milk you are producing for your baby.  False labor is a condition in which you feel small, irregular tightenings of the muscles in the womb (contractions) that eventually go away. These are called Braxton Hicks contractions. Contractions may last for hours, days, or even weeks before true labor sets in.  Signs of labor can include: abdominal cramps; regular contractions that start at 10 minutes apart and become stronger and more frequent with time; watery or bloody mucus discharge that comes from the vagina; increased pelvic pressure and dull back pain; and leaking of amniotic fluid. This information is not intended to replace advice given to you by your health  care provider. Make sure you discuss any questions you have with your health care provider. Document Released: 06/01/2001 Document Revised: 09/28/2018 Document Reviewed: 07/13/2016 Elsevier Patient Education  2020 Elsevier Inc.  

## 2018-12-21 NOTE — Addendum Note (Signed)
Addended by: Bethanne Ginger on: 12/21/2018 09:37 AM   Modules accepted: Orders

## 2019-01-15 ENCOUNTER — Other Ambulatory Visit: Payer: Self-pay | Admitting: *Deleted

## 2019-01-15 DIAGNOSIS — O09299 Supervision of pregnancy with other poor reproductive or obstetric history, unspecified trimester: Secondary | ICD-10-CM

## 2019-01-15 DIAGNOSIS — O0990 Supervision of high risk pregnancy, unspecified, unspecified trimester: Secondary | ICD-10-CM

## 2019-01-15 DIAGNOSIS — O09899 Supervision of other high risk pregnancies, unspecified trimester: Secondary | ICD-10-CM

## 2019-01-19 ENCOUNTER — Other Ambulatory Visit: Payer: Self-pay

## 2019-01-19 ENCOUNTER — Ambulatory Visit (INDEPENDENT_AMBULATORY_CARE_PROVIDER_SITE_OTHER): Payer: Self-pay | Admitting: Obstetrics & Gynecology

## 2019-01-19 VITALS — BP 109/69 | HR 76 | Temp 98.6°F | Wt 227.6 lb

## 2019-01-19 DIAGNOSIS — K5900 Constipation, unspecified: Secondary | ICD-10-CM

## 2019-01-19 DIAGNOSIS — O09299 Supervision of pregnancy with other poor reproductive or obstetric history, unspecified trimester: Secondary | ICD-10-CM

## 2019-01-19 DIAGNOSIS — O0990 Supervision of high risk pregnancy, unspecified, unspecified trimester: Secondary | ICD-10-CM

## 2019-01-19 DIAGNOSIS — O99613 Diseases of the digestive system complicating pregnancy, third trimester: Secondary | ICD-10-CM

## 2019-01-19 DIAGNOSIS — O0993 Supervision of high risk pregnancy, unspecified, third trimester: Secondary | ICD-10-CM

## 2019-01-19 DIAGNOSIS — Z23 Encounter for immunization: Secondary | ICD-10-CM

## 2019-01-19 DIAGNOSIS — O09899 Supervision of other high risk pregnancies, unspecified trimester: Secondary | ICD-10-CM

## 2019-01-19 MED ORDER — DOCUSATE SODIUM 100 MG PO CAPS: 100.0000 mg | ORAL_CAPSULE | Freq: Two times a day (BID) | ORAL | 2 refills | Status: DC | PRN

## 2019-01-19 MED ORDER — MILK OF MAGNESIA 7.75 % PO SUSP: 30.0000 mL | Freq: Every day | ORAL | 2 refills | Status: DC | PRN

## 2019-01-19 NOTE — Patient Instructions (Signed)
Regrese a la clinica cuando tenga su cita. Si tiene problemas o preguntas, llama a la clinica o vaya a la sala de [place].   Estreimiento, en adultos Constipation, Adult Estreimiento significa que una persona defeca en una semana menos que lo normal, tiene dificultad para defecar, o las heces son secas, duras, o ms grandes que lo normal. El estreimiento podra estar provocado por una enfermedad preexistente. Puede empeorar con la edad si una persona toma ciertos medicamentos y no toma suficiente lquido. Siga estas indicaciones en su casa: Qu debe comer y beber   Consuma alimentos con alto contenido de [place], como frutas y verduras frescas, cereales integrales y frijoles.  Limite los alimentos ricos en grasas y con bajo contenido de fibra, o muy procesados, como las papas fritas, [place], [place], dulces y refrescos.  Beba suficiente lquido para Consulting civil engineer orina clara o de color amarillo plido. Instrucciones generales  Haga actividad fsica habitualmente o como se lo haya indicado el mdico.  Vaya al bao cuando sienta la necesidad de ir. No se aguante las ganas.  Tome los medicamentos de venta libre y los recetados solamente como se lo haya indicado el mdico. Estos incluyen los suplementos de [place].  Practique ejercicios de rehabilitacin del suelo plvico, como la respiracin profunda mientras relaja la parte inferior del abdomen y la relajacin del suelo plvico mientras defeca.  Controle su afeccin para Actuary cambio.  Concurra a todas las visitas de seguimiento como se lo haya indicado el mdico. Esto es importante. Comunquese con un mdico si:  Su dolor empeora.  Tiene fiebre.  No defeca despus de 4das.  Vomita.  No tiene hambre.  Pierde peso.  Tiene una hemorragia en el ano.  Las heces son delgadas como un lpiz. Solicite ayuda de inmediato si:  Tiene fiebre y los sntomas empeoran repentinamente.   Observa que se filtran heces o hay sangre en las heces.  Tiene el abdomen distendido.  Siente un dolor intenso en el abdomen.  Se siente mareado o se desmaya. Esta informacin no tiene Marine scientist el consejo del mdico. Asegrese de hacerle al mdico cualquier pregunta que tenga. Document Released: 06/27/2007 Document Revised: 09/27/2016 Document Reviewed: 11/26/2015 Elsevier Patient Education  2020 [employer].

## 2019-01-19 NOTE — Progress Notes (Signed)
Interpreter [employer] present for encounter. Pt reports constipation x15 days.  2hr GTT today. [place] for growth scheduled on 8/25.

## 2019-01-19 NOTE — Progress Notes (Signed)
   PRENATAL VISIT NOTE  Subjective:  Amy Prince is a 23 y.o. [phone] at [redacted]w[redacted]d being seen today for ongoing prenatal care.  Patient is Spanish-speaking only, Spanish interpreter present for this encounter. She is currently monitored for the following issues for this high-risk pregnancy and has Supervision of high risk pregnancy, antepartum; H/O pre-eclampsia in prior pregnancy, currently pregnant; H/O preterm delivery, currently pregnant; Language barrier; and Obesity in pregnancy on their problem list.  Patient reports constipation for several days, had two bowel movements in the last week. Has been drinking a lot of water, drinking prune juice without help.  Contractions: Not present. Vag. Bleeding: None.  Movement: Present. Denies leaking of fluid.   The following portions of the patient's history were reviewed and updated as appropriate: allergies, current medications, past family history, past medical history, past social history, past surgical history and problem list.   Objective:   Vitals:   01/19/19 0855  BP: 109/69  Pulse: 76  Temp: 98.6 F (37 C)  Weight: 227 lb 9.6 oz (103.2 kg)    Fetal Status: Fetal Heart Rate (bpm): 136   Movement: Present     General:  Alert, oriented and cooperative. Patient is in no acute distress.  Skin: Skin is warm and dry. No rash noted.   Cardiovascular: Normal heart rate noted  Respiratory: Normal respiratory effort, no problems with respiration noted  Abdomen: Soft, gravid, appropriate for gestational age.  Pain/Pressure: Absent     Pelvic: Cervical exam deferred        Extremities: Normal range of motion.  Edema: None  Mental Status: Normal mood and affect. Normal behavior. Normal judgment and thought content.   Assessment and Plan:  Pregnancy: [id number] at [redacted]w[redacted]d 1. Constipation during pregnancy, third trimester Prescribed constipation agents to help, advised to continue drinking more water. Will moniotr response. - magnesium hydroxide  (MILK OF MAGNESIA) 400 MG/5ML suspension; Take 30 mLs by mouth daily as needed for mild constipation or moderate constipation.  Dispense: 355 mL; Refill: 2 - docusate sodium (COLACE) 100 MG capsule; Take 1 capsule (100 mg total) by mouth 2 (two) times daily as needed for mild constipation or moderate constipation.  Dispense: 30 capsule; Refill: 2  2. Supervision of high risk pregnancy, antepartum Third trimester labs done, Tdap given today. - Tdap vaccine greater than or equal to 7yo IM Preterm labor symptoms and general obstetric precautions including but not limited to vaginal bleeding, contractions, leaking of fluid and fetal movement were reviewed in detail with the patient. Please refer to After Visit Summary for other counseling recommendations.   Return in about 2 weeks (around 02/02/2019) for Virtual OB Visit (Spanish).  Future Appointments  Date Time Provider [place]  02/13/2019 12:30 PM [facility] NURSE [facility] MFC-US  02/13/2019 12:30 PM [place] 1 WH-MFCUS MFC-US  02/15/2019  9:35 AM Woodroe Mode, MD [place]    Verita Schneiders, MD

## 2019-01-20 LAB — COMPREHENSIVE METABOLIC PANEL
ALT: 22 IU/L (ref 0–32)
AST: 17 IU/L (ref 0–40)
Albumin/Globulin Ratio: 1.4 (ref 1.2–2.2)
Albumin: 4 g/dL (ref 3.9–5.0)
Alkaline Phosphatase: 150 IU/L — ABNORMAL HIGH (ref 39–117)
BUN/Creatinine Ratio: 13 (ref 9–23)
BUN: 7 mg/dL (ref 6–20)
Bilirubin Total: 0.4 mg/dL (ref 0.0–1.2)
CO2: 22 mmol/L (ref 20–29)
Calcium: 9.6 mg/dL (ref 8.7–10.2)
Chloride: 100 mmol/L (ref 96–106)
Creatinine, Ser: 0.56 mg/dL — ABNORMAL LOW (ref 0.57–1.00)
GFR calc Af Amer: 151 mL/min/{1.73_m2} (ref >59)
GFR calc non Af Amer: 131 mL/min/{1.73_m2} (ref >59)
Globulin, Total: 2.8 g/dL (ref 1.5–4.5)
Glucose: 78 mg/dL (ref 65–99)
Potassium: 4 mmol/L (ref 3.5–5.2)
Sodium: 138 mmol/L (ref 134–144)
Total Protein: 6.8 g/dL (ref 6.0–8.5)

## 2019-01-20 LAB — HIV ANTIBODY (ROUTINE TESTING W REFLEX): HIV Screen 4th Generation wRfx: NONREACTIVE

## 2019-01-20 LAB — CBC
Hematocrit: 36 % (ref 34.0–46.6)
Hemoglobin: 11.9 g/dL (ref 11.1–15.9)
MCH: 29.5 pg (ref 26.6–33.0)
MCHC: 33.1 g/dL (ref 31.5–35.7)
MCV: 89 fL (ref 79–97)
Platelets: 261 10*3/uL (ref 150–450)
RBC: 4.03 x10E6/uL (ref 3.77–5.28)
RDW: 12.7 % (ref 11.7–15.4)
WBC: 8.6 10*3/uL (ref 3.4–10.8)

## 2019-01-20 LAB — PROTEIN / CREATININE RATIO, URINE
Creatinine, Urine: 106.3 mg/dL
Protein, Ur: 26.4 mg/dL
Protein/Creat Ratio: 248 mg/g creat — ABNORMAL HIGH (ref 0–200)

## 2019-01-20 LAB — GLUCOSE TOLERANCE, 2 HOURS W/ 1HR
Glucose, 1 hour: 126 mg/dL (ref 65–179)
Glucose, 2 hour: 131 mg/dL (ref 65–152)
Glucose, Fasting: 74 mg/dL (ref 65–91)

## 2019-01-20 LAB — RPR: RPR Ser Ql: NONREACTIVE

## 2019-02-02 ENCOUNTER — Ambulatory Visit (INDEPENDENT_AMBULATORY_CARE_PROVIDER_SITE_OTHER): Payer: Self-pay | Admitting: Obstetrics and Gynecology

## 2019-02-02 ENCOUNTER — Encounter: Payer: Self-pay | Admitting: Obstetrics and Gynecology

## 2019-02-02 ENCOUNTER — Other Ambulatory Visit: Payer: Self-pay

## 2019-02-02 VITALS — BP 114/86 | HR 83

## 2019-02-02 DIAGNOSIS — O09293 Supervision of pregnancy with other poor reproductive or obstetric history, third trimester: Secondary | ICD-10-CM

## 2019-02-02 DIAGNOSIS — Z758 Other problems related to medical facilities and other health care: Secondary | ICD-10-CM

## 2019-02-02 DIAGNOSIS — O99210 Obesity complicating pregnancy, unspecified trimester: Secondary | ICD-10-CM

## 2019-02-02 DIAGNOSIS — Z789 Other specified health status: Secondary | ICD-10-CM

## 2019-02-02 DIAGNOSIS — O0990 Supervision of high risk pregnancy, unspecified, unspecified trimester: Secondary | ICD-10-CM

## 2019-02-02 DIAGNOSIS — O09899 Supervision of other high risk pregnancies, unspecified trimester: Secondary | ICD-10-CM

## 2019-02-02 DIAGNOSIS — O09299 Supervision of pregnancy with other poor reproductive or obstetric history, unspecified trimester: Secondary | ICD-10-CM

## 2019-02-02 DIAGNOSIS — O99213 Obesity complicating pregnancy, third trimester: Secondary | ICD-10-CM

## 2019-02-02 DIAGNOSIS — O09213 Supervision of pregnancy with history of pre-term labor, third trimester: Secondary | ICD-10-CM

## 2019-02-02 DIAGNOSIS — O0993 Supervision of high risk pregnancy, unspecified, third trimester: Secondary | ICD-10-CM

## 2019-02-02 DIAGNOSIS — Z3A30 30 weeks gestation of pregnancy: Secondary | ICD-10-CM

## 2019-02-02 NOTE — Progress Notes (Signed)
I connected with  Amy Prince on 02/02/19 at  8:55 AM EDT by telephone and verified that I am speaking with the correct person using two identifiers.   I discussed the limitations, risks, security and privacy concerns of performing an evaluation and management service by telephone and virtually and the availability of in person appointments. I also discussed with the patient that there may be a patient responsible charge related to this service. The patient expressed understanding and agreed to proceed.  States her blood pressure cuff needs batteries and she forgot to get them. I informed her it is very important she check her blood pressure weekly. I instructed her to get batteries asap and take her blood pressure and call us with results.   Linda,RN 02/02/2019  8:33 AM

## 2019-02-02 NOTE — Progress Notes (Signed)
   [place] VIRTUAL VIDEO VISIT ENCOUNTER NOTE  Provider location: [facility] for [employer] at [facility]   I connected with [name] on 02/02/19 at  8:55 AM EDT by WebEx Video Encounter at home and verified that I am speaking with the correct person using two identifiers.   I discussed the limitations, risks, security and privacy concerns of performing an evaluation and management service virtually and the availability of in person appointments. I also discussed with the patient that there may be a patient responsible charge related to this service. The patient expressed understanding and agreed to proceed. Subjective:  Amy Prince is a 23 y.o. [phone] at [redacted]w[redacted]d being seen today for ongoing prenatal care.  She is currently monitored for the following issues for this high-risk pregnancy and has Supervision of high risk pregnancy, antepartum; H/O pre-eclampsia in prior pregnancy, currently pregnant; H/O preterm delivery, currently pregnant; Language barrier; and Obesity in pregnancy on their problem list.  Patient reports no complaints.  Contractions: Not present. Vag. Bleeding: None.  Movement: Present. Denies any leaking of fluid.   The following portions of the patient's history were reviewed and updated as appropriate: allergies, current medications, past family history, past medical history, past social history, past surgical history and problem list.   Objective:   Vitals:   02/02/19 0843  BP: 114/86  Pulse: 83    Fetal Status:     Movement: Present     General:  Alert, oriented and cooperative. Patient is in no acute distress.  Respiratory: Normal respiratory effort, no problems with respiration noted  Mental Status: Normal mood and affect. Normal behavior. Normal judgment and thought content.  Rest of physical exam deferred due to type of encounter  Imaging: No results found.  Assessment and Plan:  Pregnancy: [id number] at [redacted]w[redacted]d  1.  Supervision of high risk pregnancy, antepartum - has f/u growth scheduled for 02/13/19 (97th%tile)  2. H/O pre-eclampsia in prior pregnancy, currently pregnant Cont baby aspirin  3. H/O preterm delivery, currently pregnant  4. Language barrier Patent attorney used  5. Obesity in pregnancy  Preterm labor symptoms and general obstetric precautions including but not limited to vaginal bleeding, contractions, leaking of fluid and fetal movement were reviewed in detail with the patient. I discussed the assessment and treatment plan with the patient. The patient was provided an opportunity to ask questions and all were answered. The patient agreed with the plan and demonstrated an understanding of the instructions. The patient was advised to call back or seek an in-person office evaluation/go to MAU at [facility] for any urgent or concerning symptoms. Please refer to After Visit Summary for other counseling recommendations.   I provided 25 minutes of face-to-face time during this encounter.  Return in about 2 weeks (around 02/16/2019) for OB visit (MD) as scheduled.  Future Appointments  Date Time Provider [place]  02/13/2019 12:30 PM [facility] NURSE [facility] MFC-US  02/13/2019 12:30 PM [place] 1 WH-MFCUS MFC-US  02/15/2019  9:35 AM Woodroe Mode, MD [place], [place] for [facility], [place]

## 2019-02-13 ENCOUNTER — Encounter (HOSPITAL_COMMUNITY): Payer: Self-pay

## 2019-02-13 ENCOUNTER — Ambulatory Visit (HOSPITAL_COMMUNITY): Payer: Self-pay | Admitting: *Deleted

## 2019-02-13 ENCOUNTER — Ambulatory Visit (HOSPITAL_COMMUNITY)
Admission: RE | Admit: 2019-02-13 | Discharge: 2019-02-13 | Disposition: A | Discharge status: Home / Self Care | Payer: Self-pay | Source: Ambulatory Visit | Attending: Obstetrics and Gynecology | Admitting: Obstetrics and Gynecology

## 2019-02-13 ENCOUNTER — Other Ambulatory Visit: Payer: Self-pay

## 2019-02-13 DIAGNOSIS — O99210 Obesity complicating pregnancy, unspecified trimester: Secondary | ICD-10-CM

## 2019-02-13 DIAGNOSIS — Z362 Encounter for other antenatal screening follow-up: Secondary | ICD-10-CM

## 2019-02-13 DIAGNOSIS — O09293 Supervision of pregnancy with other poor reproductive or obstetric history, third trimester: Secondary | ICD-10-CM

## 2019-02-13 DIAGNOSIS — O99213 Obesity complicating pregnancy, third trimester: Secondary | ICD-10-CM

## 2019-02-13 DIAGNOSIS — O0990 Supervision of high risk pregnancy, unspecified, unspecified trimester: Secondary | ICD-10-CM

## 2019-02-13 DIAGNOSIS — Z3A32 32 weeks gestation of pregnancy: Secondary | ICD-10-CM

## 2019-02-13 DIAGNOSIS — Z3689 Encounter for other specified antenatal screening: Secondary | ICD-10-CM | POA: Insufficient documentation

## 2019-02-13 DIAGNOSIS — O09213 Supervision of pregnancy with history of pre-term labor, third trimester: Secondary | ICD-10-CM

## 2019-02-14 ENCOUNTER — Telehealth: Payer: Self-pay | Admitting: Obstetrics & Gynecology

## 2019-02-14 NOTE — Telephone Encounter (Signed)
Spanish interpreter Eda called patient about her appointment on 8/27 @ 9:35. Patient instructed the appointment is a virtual visit. Patient verbalized she has the app downloaded.

## 2019-02-15 ENCOUNTER — Other Ambulatory Visit: Payer: Self-pay

## 2019-02-15 ENCOUNTER — Encounter: Payer: Self-pay | Admitting: Obstetrics & Gynecology

## 2019-02-15 ENCOUNTER — Ambulatory Visit (INDEPENDENT_AMBULATORY_CARE_PROVIDER_SITE_OTHER): Payer: Self-pay | Admitting: Obstetrics & Gynecology

## 2019-02-15 VITALS — BP 116/77 | HR 73

## 2019-02-15 DIAGNOSIS — O09293 Supervision of pregnancy with other poor reproductive or obstetric history, third trimester: Secondary | ICD-10-CM

## 2019-02-15 DIAGNOSIS — Z3A32 32 weeks gestation of pregnancy: Secondary | ICD-10-CM

## 2019-02-15 DIAGNOSIS — O0990 Supervision of high risk pregnancy, unspecified, unspecified trimester: Secondary | ICD-10-CM

## 2019-02-15 DIAGNOSIS — O09299 Supervision of pregnancy with other poor reproductive or obstetric history, unspecified trimester: Secondary | ICD-10-CM

## 2019-02-15 DIAGNOSIS — O0993 Supervision of high risk pregnancy, unspecified, third trimester: Secondary | ICD-10-CM

## 2019-02-15 NOTE — Patient Instructions (Signed)
Tercer trimestre de embarazo Third Trimester of Pregnancy  El tercer trimestre comprende desde la semana28 hasta la semana40 (desde el mes7 hasta el mes9). En este trimestre, el beb en gestacin (feto) crece muy rpidamente. Hacia el final del noveno mes, el beb en gestacin mide alrededor de 20pulgadas (45cm) de largo. Pesa entre 6y 10libras (2,70y 4,50kg). Siga estas indicaciones en su casa: Medicamentos  Tome los medicamentos de venta libre y los recetados solamente como se lo haya indicado el mdico. Algunos medicamentos son seguros para tomar durante el embarazo y otros no lo son.  Tome vitaminas prenatales que contengan por lo menos 600microgramos (?g) de cido flico.  Si tiene dificultad para mover el intestino (estreimiento), tome un medicamento para ablandar las heces (laxante) si su mdico se lo autoriza. Comida y bebida   Ingiera alimentos saludables de manera regular.  No coma carne cruda ni quesos sin cocinar.  Si obtiene poca cantidad de calcio de los alimentos que ingiere, consulte a su mdico sobre la posibilidad de tomar un suplemento diario de calcio.  La ingesta diaria de cuatro o cinco comidas pequeas en lugar de tres comidas abundantes.  Evite el consumo de alimentos ricos en grasas y azcares, como los alimentos fritos y los dulces.  Para evitar el estreimiento: ? Consuma alimentos ricos en fibra, como frutas y verduras frescas, cereales integrales y frijoles. ? Beba suficiente lquido para mantener el pis (orina) claro o de color amarillo plido. Actividad  Haga ejercicios solamente como se lo haya indicado el mdico. Interrumpa la actividad fsica si comienza a tener calambres.  No levante objetos pesados, use zapatos de tacones bajos y sintese derecha.  No haga ejercicio si hace demasiado calor, hay demasiada humedad o se encuentra en un lugar de mucha altura (altitud alta).  Puede continuar teniendo relaciones sexuales, a menos que el  mdico le indique lo contrario. Alivio del dolor y del malestar  Use un sostn que le brinde buen soporte si sus mamas estn sensibles.  Haga pausas frecuentes y descanse con las piernas levantadas si tiene calambres en las piernas o dolor en la zona lumbar.  Dese baos de asiento con agua tibia para aliviar el dolor o las molestias causadas por las hemorroides. Use una crema para las hemorroides si el mdico la autoriza.  Si desarrolla venas hinchadas y abultadas (vrices) en las piernas: ? Use medias de compresin o medias de descanso como se lo haya indicado el mdico. ? Levante (eleve) los pies durante 15minutos, 3 o 4veces por da. ? Limite el consumo de sal en sus alimentos. Seguridad  Colquese el cinturn de seguridad cuando conduzca.  Haga una lista de los nmeros de telfono de emergencia, que incluya los nmeros de telfono de familiares, amigos, el hospital, as como los departamentos de polica y bomberos. Preparacin para la llegada del beb Para prepararse para la llegada de su beb:  Tome clases prenatales.  Practique ir manejando al hospital.  Visite el hospital y recorra el rea de maternidad.  Hable en su trabajo acerca de tomar licencia cuando llegue el beb.  Prepare el bolso que llevar al hospital.  Prepare la habitacin del beb.  Concurra a los controles mdicos.  Compre un asiento de seguridad orientado hacia atrs para llevar al beb en el automvil. Aprenda cmo instalarlo en el auto. Instrucciones generales  No se d baos de inmersin en agua caliente, baos turcos ni saunas.  No consuma ningn producto que contenga nicotina o tabaco, como cigarrillos y cigarrillos   electrnicos. Si necesita ayuda para dejar de fumar, consulte al mdico.  No beba alcohol.  No se haga duchas vaginales ni use tampones o toallas higinicas perfumadas.  No mantenga las piernas cruzadas durante mucho tiempo.  No haga viajes de larga distancia, excepto si es  obligatorio. Hgalos solamente si su mdico la autoriza.  Visite a su dentista si no lo ha hecho durante el embarazo. Use un cepillo de cerdas suaves para cepillarse los dientes. Psese el hilo dental con suavidad.  Evite el contacto con las bandejas sanitarias de los gatos y la tierra que estos animales usan. Estos elementos contienen bacterias que pueden causar defectos congnitos al beb y la posible prdida del beb (aborto espontneo) o la muerte fetal.  Concurra a todas las visitas prenatales como se lo haya indicado el mdico. Esto es importante. Comunquese con un mdico si:  No est segura de si est en trabajo de parto o si ha roto la bolsa de las aguas.  Tiene mareos.  Tiene clicos leves o siente presin en la parte baja del vientre.  Sufre un dolor persistente en el abdomen.  Sigue teniendo malestar estomacal, vomita o tiene heces lquidas.  Advierte un lquido con olor ftido que proviene de la vagina.  Siente dolor al orinar. Solicite ayuda de inmediato si:  Tiene fiebre.  Tiene una prdida de lquido por la vagina.  Tiene sangrado o pequeas prdidas vaginales.  Siente dolor intenso o clicos en el abdomen.  Aumenta o baja de peso rpidamente.  Tiene dificultades para recuperar el aliento y siente dolor en el pecho.  Sbitamente se le hinchan mucho el rostro, las manos, los tobillos, los pies o las piernas.  No ha sentido los movimientos del beb durante una hora.  Siente un dolor de cabeza intenso que no se alivia con medicamentos.  Tiene dificultad para ver.  Tiene prdida de lquido o le sale un chorro de lquido de la vagina antes de estar en la semana 37.  Tiene espasmos abdominales (contracciones) regulares antes de estar en la semana 37. Resumen  El tercer trimestre comprende desde la semana28 hasta la semana40 (desde el mes7 hasta el mes9). Esta es la poca en que el beb en gestacin crece muy rpidamente.  Siga los consejos del mdico  con respecto a los medicamentos, la alimentacin y la actividad.  Preprese para la llegada del beb tomando las clases prenatales, preparando todo lo que necesitar el beb, arreglando la habitacin del beb y concurriendo a los controles mdicos.  Solicite ayuda de inmediato si tiene sangrado por la vagina, siente dolor en el pecho o tiene dificultad para respirar, o si no ha sentido que su beb se mueve en el transcurso de ms de una hora. Esta informacin no tiene como fin reemplazar el consejo del mdico. Asegrese de hacerle al mdico cualquier pregunta que tenga. Document Released: 02/07/2013 Document Revised: 01/10/2017 Document Reviewed: 01/10/2017 Elsevier Patient Education  2020 Elsevier Inc.  

## 2019-02-15 NOTE — Progress Notes (Signed)
   TELEHEALTH VIRTUAL OBSTETRICS VISIT ENCOUNTER NOTE  I connected with Amy Prince on 02/15/19 at  9:35 AM EDT by telephone at home and verified that I am speaking with the correct person using two identifiers.   I discussed the limitations, risks, security and privacy concerns of performing an evaluation and management service by telephone and the availability of in person appointments. I also discussed with the patient that there may be a patient responsible charge related to this service. The patient expressed understanding and agreed to proceed.  Subjective:  Amy Prince is a 23 y.o. [phone] at [redacted]w[redacted]d being followed for ongoing prenatal care.  She is currently monitored for the following issues for this high-risk pregnancy and has Supervision of high risk pregnancy, antepartum; H/O pre-eclampsia in prior pregnancy, currently pregnant; H/O preterm delivery, currently pregnant; Language barrier; and Obesity in pregnancy on their problem list.  Patient reports no complaints. Reports fetal movement. Denies any contractions, bleeding or leaking of fluid.   The following portions of the patient's history were reviewed and updated as appropriate: allergies, current medications, past family history, past medical history, past social history, past surgical history and problem list.   Objective:   General:  Alert, oriented and cooperative.   Mental Status: Normal mood and affect perceived. Normal judgment and thought content.  Rest of physical exam deferred due to type of encounter  Assessment and Plan:  Pregnancy: [id number] at [redacted]w[redacted]d 1. Supervision of high risk pregnancy, antepartum norml groqth [place] 8/25  2. H/O pre-eclampsia in prior pregnancy, currently pregnant Normal BP, checking at home twice a week  Preterm labor symptoms and general obstetric precautions including but not limited to vaginal bleeding, contractions, leaking of fluid and fetal movement were reviewed in detail with the  patient.  I discussed the assessment and treatment plan with the patient. The patient was provided an opportunity to ask questions and all were answered. The patient agreed with the plan and demonstrated an understanding of the instructions. The patient was advised to call back or seek an in-person office evaluation/go to MAU at [facility] for any urgent or concerning symptoms. Please refer to After Visit Summary for other counseling recommendations.   I provided 12 minutes of non-face-to-face time during this encounter. [place] interpreter used  Return in about 2 weeks (around 03/01/2019) for virtual.  No future appointments.  Emeterio Reeve, MD Center for [place], [place]

## 2019-03-08 ENCOUNTER — Other Ambulatory Visit: Payer: Self-pay

## 2019-03-08 ENCOUNTER — Encounter: Payer: Self-pay | Admitting: Obstetrics & Gynecology

## 2019-03-08 ENCOUNTER — Ambulatory Visit (INDEPENDENT_AMBULATORY_CARE_PROVIDER_SITE_OTHER): Payer: Self-pay | Admitting: Obstetrics & Gynecology

## 2019-03-08 VITALS — BP 123/73

## 2019-03-08 DIAGNOSIS — O0993 Supervision of high risk pregnancy, unspecified, third trimester: Secondary | ICD-10-CM

## 2019-03-08 DIAGNOSIS — Z789 Other specified health status: Secondary | ICD-10-CM

## 2019-03-08 DIAGNOSIS — O09299 Supervision of pregnancy with other poor reproductive or obstetric history, unspecified trimester: Secondary | ICD-10-CM

## 2019-03-08 DIAGNOSIS — O99210 Obesity complicating pregnancy, unspecified trimester: Secondary | ICD-10-CM

## 2019-03-08 DIAGNOSIS — O99213 Obesity complicating pregnancy, third trimester: Secondary | ICD-10-CM

## 2019-03-08 DIAGNOSIS — O09899 Supervision of other high risk pregnancies, unspecified trimester: Secondary | ICD-10-CM

## 2019-03-08 DIAGNOSIS — O0990 Supervision of high risk pregnancy, unspecified, unspecified trimester: Secondary | ICD-10-CM

## 2019-03-08 DIAGNOSIS — O09213 Supervision of pregnancy with history of pre-term labor, third trimester: Secondary | ICD-10-CM

## 2019-03-08 DIAGNOSIS — Z3A35 35 weeks gestation of pregnancy: Secondary | ICD-10-CM

## 2019-03-08 DIAGNOSIS — O09293 Supervision of pregnancy with other poor reproductive or obstetric history, third trimester: Secondary | ICD-10-CM

## 2019-03-08 NOTE — Progress Notes (Signed)
[place] Interpreter # [phone]

## 2019-03-08 NOTE — Progress Notes (Signed)
   TELEHEALTH VIRTUAL OBSTETRICS VISIT ENCOUNTER NOTE  I connected with Amy Prince on 03/08/19 at  1:35 PM EDT by telephone at home and verified that I am speaking with the correct person using two identifiers.   I discussed the limitations, risks, security and privacy concerns of performing an evaluation and management service by telephone and the availability of in person appointments. I also discussed with the patient that there may be a patient responsible charge related to this service. The patient expressed understanding and agreed to proceed.  Subjective:  Amy Prince is a 23 y.o. [phone] at [redacted]w[redacted]d being followed for ongoing prenatal care.  She is currently monitored for the following issues for this high-risk pregnancy and has Supervision of high risk pregnancy, antepartum; H/O pre-eclampsia in prior pregnancy, currently pregnant; H/O preterm delivery, currently pregnant; Language barrier; and Obesity in pregnancy on their problem list.  Patient reports white itchy discharge. Reports fetal movement. Denies any contractions, bleeding or leaking of fluid.   The following portions of the patient's history were reviewed and updated as appropriate: allergies, current medications, past family history, past medical history, past social history, past surgical history and problem list.   Objective:   General:  Alert, oriented and cooperative.   Mental Status: Normal mood and affect perceived. Normal judgment and thought content.  Rest of physical exam deferred due to type of encounter  Assessment and Plan:  Pregnancy: [id number] at [redacted]w[redacted]d 1. Supervision of high risk pregnancy, antepartum +FM occ ctx    2. Obesity in pregnancy  3. Language barrier Spanish  interpreter used for entire visit   4. H/O preterm delivery, currently pregnant No ctx  5. H/O pre-eclampsia in prior pregnancy, currently pregnant On baby ASA  Preterm labor symptoms and general obstetric precautions  including but not limited to vaginal bleeding, contractions, leaking of fluid and fetal movement were reviewed in detail with the patient.  I discussed the assessment and treatment plan with the patient. The patient was provided an opportunity to ask questions and all were answered. The patient agreed with the plan and demonstrated an understanding of the instructions. The patient was advised to call back or seek an in-person office evaluation/go to MAU at [facility] for any urgent or concerning symptoms. Please refer to After Visit Summary for other counseling recommendations.   I provided 10 minutes of non-face-to-face time during this encounter.  Return in about 1 week (around 03/15/2019).  No future appointments.  Lavonia Drafts, MD Center for [employer], [place]

## 2019-03-13 ENCOUNTER — Telehealth: Payer: Self-pay | Admitting: Obstetrics and Gynecology

## 2019-03-13 NOTE — Telephone Encounter (Signed)
Spanish interpreter Eda called patient about her appointment on 9/23 @ 10:15. Patient instructed to wear a face mask for the entire appointment and no visitors are allowed. Patient screened for covid symptoms and denied having any.

## 2019-03-13 NOTE — Telephone Encounter (Signed)
Attempted to call patient about her appointment on 9/23 @ 10:15. No answer left voicemail instructing patient to wear a face mask for the entire appointment and no visitors are allowed during the visit. Patient instructed not to attend the appointment if she was any symptoms. Symptom list and office number left.

## 2019-03-14 ENCOUNTER — Ambulatory Visit (INDEPENDENT_AMBULATORY_CARE_PROVIDER_SITE_OTHER): Payer: Self-pay | Admitting: Obstetrics and Gynecology

## 2019-03-14 ENCOUNTER — Other Ambulatory Visit: Payer: Self-pay

## 2019-03-14 ENCOUNTER — Other Ambulatory Visit: Payer: Self-pay | Admitting: Obstetrics and Gynecology

## 2019-03-14 VITALS — BP 129/75 | HR 76 | Wt 243.9 lb

## 2019-03-14 DIAGNOSIS — Z3A36 36 weeks gestation of pregnancy: Secondary | ICD-10-CM

## 2019-03-14 DIAGNOSIS — Z789 Other specified health status: Secondary | ICD-10-CM

## 2019-03-14 DIAGNOSIS — O09293 Supervision of pregnancy with other poor reproductive or obstetric history, third trimester: Secondary | ICD-10-CM

## 2019-03-14 DIAGNOSIS — O09299 Supervision of pregnancy with other poor reproductive or obstetric history, unspecified trimester: Secondary | ICD-10-CM

## 2019-03-14 DIAGNOSIS — O0990 Supervision of high risk pregnancy, unspecified, unspecified trimester: Secondary | ICD-10-CM

## 2019-03-14 DIAGNOSIS — O0993 Supervision of high risk pregnancy, unspecified, third trimester: Secondary | ICD-10-CM

## 2019-03-14 DIAGNOSIS — Z113 Encounter for screening for infections with a predominantly sexual mode of transmission: Secondary | ICD-10-CM

## 2019-03-14 DIAGNOSIS — B3731 Acute candidiasis of vulva and vagina: Secondary | ICD-10-CM

## 2019-03-14 DIAGNOSIS — B373 Candidiasis of vulva and vagina: Secondary | ICD-10-CM

## 2019-03-14 MED ORDER — MICONAZOLE NITRATE 2 % VA CREA: 1.0000 | TOPICAL_CREAM | Freq: Every day | VAGINAL | 2 refills | Status: DC

## 2019-03-14 NOTE — Progress Notes (Signed)
Prenatal Visit Note Date: 03/14/2019 [facility]: [facility]  Subjective:  Amy Prince is a 23 y.o. [phone] at [redacted]w[redacted]d being seen today for ongoing prenatal care.  She is currently monitored for the following issues for this high-risk pregnancy and has Supervision of high risk pregnancy, antepartum; H/O pre-eclampsia in prior pregnancy, currently pregnant; H/O preterm delivery, currently pregnant; Language barrier; and Obesity in pregnancy on their problem list.  Patient reports vaginitis Contractions: Irritability. Vag. Bleeding: None.  Movement: Present. Denies leaking of fluid.   The following portions of the patient's history were reviewed and updated as appropriate: allergies, current medications, past family history, past medical history, past social history, past surgical history and problem list. Problem list updated.  Objective:   Vitals:   03/14/19 1053  BP: 129/75  Pulse: 76  Weight: 243 lb 14.4 oz (110.6 kg)    Fetal Status: Fetal Heart Rate (bpm): 159 Fundal Height: 38 cm Movement: Present  Presentation: Vertex  General:  Alert, oriented and cooperative. Patient is in no acute distress.  Skin: Skin is warm and dry. No rash noted.   Cardiovascular: Normal heart rate noted  Respiratory: Normal respiratory effort, no problems with respiration noted  Abdomen: Soft, gravid, appropriate for gestational age. Pain/Pressure: Present     Pelvic:  Cervical exam performed white cottage cheese like d/c on glove. Dilation: 1.5 Effacement (%): 50 Station: Ballotable  Extremities: Normal range of motion.  Edema: None  Mental Status: Normal mood and affect. Normal behavior. Normal judgment and thought content.   Urinalysis:      Assessment and Plan:  Pregnancy: [id number] at [redacted]w[redacted]d  1. Supervision of high risk pregnancy, antepartum Routine care - GC/Chlamydia Probe Amp - Culture, beta strep (group b only)  2. H/O pre-eclampsia in prior pregnancy, currently  pregnant contiue low dose asa  3. Language barrier Interpreter used  4. Vulvovaginal candidiasis Monistat 7 sent in  Preterm labor symptoms and general obstetric precautions including but not limited to vaginal bleeding, contractions, leaking of fluid and fetal movement were reviewed in detail with the patient. Please refer to After Visit Summary for other counseling recommendations.  Return in about 1 week (around 03/21/2019) for low risk.   Aletha Halim, MD

## 2019-03-15 LAB — CERVICOVAGINAL ANCILLARY ONLY
Chlamydia: NEGATIVE
Neisseria Gonorrhea: NEGATIVE

## 2019-03-18 LAB — CULTURE, BETA STREP (GROUP B ONLY): Strep Gp B Culture: NEGATIVE

## 2019-03-21 ENCOUNTER — Other Ambulatory Visit: Payer: Self-pay

## 2019-03-21 ENCOUNTER — Encounter (HOSPITAL_COMMUNITY): Payer: Self-pay

## 2019-03-21 ENCOUNTER — Inpatient Hospital Stay (HOSPITAL_COMMUNITY)
Admission: AD | Admit: 2019-03-21 | Discharge: 2019-03-22 | Disposition: A | Discharge status: Home / Self Care | Payer: Self-pay | Attending: Obstetrics and Gynecology | Admitting: Obstetrics and Gynecology

## 2019-03-21 DIAGNOSIS — Z3A37 37 weeks gestation of pregnancy: Secondary | ICD-10-CM | POA: Insufficient documentation

## 2019-03-21 DIAGNOSIS — M545 Low back pain: Secondary | ICD-10-CM

## 2019-03-21 DIAGNOSIS — Z7982 Long term (current) use of aspirin: Secondary | ICD-10-CM | POA: Insufficient documentation

## 2019-03-21 DIAGNOSIS — O26893 Other specified pregnancy related conditions, third trimester: Secondary | ICD-10-CM

## 2019-03-21 DIAGNOSIS — O479 False labor, unspecified: Secondary | ICD-10-CM

## 2019-03-21 DIAGNOSIS — O471 False labor at or after 37 completed weeks of gestation: Secondary | ICD-10-CM | POA: Insufficient documentation

## 2019-03-21 LAB — URINALYSIS, ROUTINE W REFLEX MICROSCOPIC
Bilirubin Urine: NEGATIVE
Glucose, UA: NEGATIVE mg/dL
Hgb urine dipstick: NEGATIVE
Ketones, ur: NEGATIVE mg/dL
Leukocytes,Ua: NEGATIVE
Nitrite: NEGATIVE
Protein, ur: NEGATIVE mg/dL
Specific Gravity, Urine: 1.016 (ref 1.005–1.030)
pH: 7 (ref 5.0–8.0)

## 2019-03-21 MED ORDER — CYCLOBENZAPRINE HCL 10 MG PO TABS
5.0000 mg | ORAL_TABLET | Freq: Once | ORAL | Status: AC
  Administered 2019-03-21: 5 mg via ORAL
  Filled 2019-03-21: qty 1

## 2019-03-21 NOTE — MAU Provider Note (Signed)
Chief Complaint:  Back Pain and Abdominal Pain   First Provider Initiated Contact with Patient 03/21/19 2132      HPI: Amy Prince is a 23 y.o. [id number] at 41w2dwho presents to maternity admissions reporting lower abdominal and low back pain with pain in thighs when walking. Spanish interpretor used. . She reports good fetal movement, denies LOF, vaginal bleeding, vaginal itching/burning, urinary symptoms, h/a, dizziness, n/v, diarrhea, constipation or fever/chills.    RN Note: Presents with abd and back pain today. No vag bldg or LOF. Pt with some white d/c, no odor or itching. C/o HA's, no dizziness or visual changes.    Past Medical History: History reviewed. No pertinent past medical history.  Past obstetric history: OB History  Gravida Para Term Preterm AB Living  3 1   1 1 1   SAB TAB Ectopic Multiple Live Births  1       1    # Outcome Date GA Lbr Len/2nd Weight Sex Delivery Anes PTL Lv  3 Current           2 Preterm 11/25/11 [redacted]w[redacted]d  2600 g M Vag-Spont        Birth Comments: induced at 50 weeks for preeclampsia  1 SAB  [redacted]w[redacted]d           Past Surgical History: Past Surgical History:  Procedure Laterality Date  . CHOLECYSTECTOMY OPEN  2013    Family History: Family History  Problem Relation Age of Onset  . Kidney disease Father     Social History: Social History   Tobacco Use  . Smoking status: Never Smoker  . Smokeless tobacco: Never Used  Substance Use Topics  . Alcohol use: Never    Frequency: Never  . Drug use: Never    Allergies: No Known Allergies  Meds:  Medications Prior to Admission  Medication Sig Dispense Refill Last Dose  . acetaminophen (TYLENOL) 325 MG tablet Take 650 mg by mouth every 6 (six) hours as needed.     [name] aspirin 81 MG chewable tablet Chew 81 mg by mouth daily.     [name] docusate sodium (COLACE) 100 MG capsule Take 1 capsule (100 mg total) by mouth 2 (two) times daily as needed for mild constipation or moderate constipation. 30  capsule 2   . magnesium hydroxide (MILK OF MAGNESIA) 400 MG/5ML suspension Take 30 mLs by mouth daily as needed for mild constipation or moderate constipation. (Patient not taking: Reported on 02/02/2019) 355 mL 2   . miconazole (MONISTAT 7) 2 % vaginal cream Place 1 Applicatorful vaginally at bedtime. Apply for seven nights 30 g 2   . Prenatal Vit-Fe Fumarate-FA (PRENATAL MULTIVITAMIN) TABS tablet Take 1 tablet by mouth daily at 12 noon. 30 tablet 11     I have reviewed patient's Past Medical Hx, Surgical Hx, Family Hx, Social Hx, medications and allergies.   ROS:  Review of Systems  Constitutional: Negative for chills and fever.  Respiratory: Negative for shortness of breath.   Gastrointestinal: Positive for abdominal pain. Negative for constipation, diarrhea, nausea and vomiting.  Genitourinary: Positive for pelvic pain and vaginal discharge. Negative for vaginal bleeding.  Musculoskeletal: Positive for back pain.   Other systems negative  Physical Exam   Patient Vitals for the past 24 hrs:  BP Temp Temp src Pulse Resp  03/21/19 2115 (!) 114/57 - - (!) 107 -  03/21/19 2100 132/79 - - 99 -  03/21/19 2032 (!) 134/94 98.5 F (36.9 C) Oral 95 19  Vitals:   03/21/19 2200 03/21/19 2215 03/21/19 2230 03/22/19 0101  BP: 120/75 116/74 131/80 (!) 110/57  Pulse: (!) 103 97 98 94  Resp:    18  Temp:    98.4 F (36.9 C)  TempSrc:    Oral    Constitutional: Well-developed, well-nourished female in no acute distress. Suspect the single elevated BP was spurious.  Cardiovascular: normal rate and rhythm Respiratory: normal effort, clear to auscultation bilaterally GI: Abd soft, non-tender, gravid appropriate for gestational age.   No rebound or guarding. MS: Extremities nontender, no edema, normal ROM Neurologic: Alert and oriented x 4.   Negative leg raise.  Normal range of motion for both legs.  GU: Neg CVAT.  PELVIC EXAM:  Dilation: 1.5 Effacement (%): 60 Cervical Position:  Posterior Station: -3 Presentation: Vertex Exam by:: Adah Perl RN  FHT:  Baseline 140 , moderate variability, accelerations present, no decelerations Contractions: q 5 mins Irregular    Labs: Results for orders placed or performed during the hospital encounter of 03/21/19 (from the past 24 hour(s))  Urinalysis, Routine w reflex microscopic     Status: Abnormal   Collection Time: 03/21/19  9:07 PM  Result Value Ref Range   Color, Urine YELLOW YELLOW   APPearance CLOUDY (A) CLEAR   Specific Gravity, Urine 1.016 1.005 - 1.030   pH 7.0 5.0 - 8.0   Glucose, UA NEGATIVE NEGATIVE mg/dL   Hgb urine dipstick NEGATIVE NEGATIVE   Bilirubin Urine NEGATIVE NEGATIVE   Ketones, ur NEGATIVE NEGATIVE mg/dL   Protein, ur NEGATIVE NEGATIVE mg/dL   Nitrite NEGATIVE NEGATIVE   Leukocytes,Ua NEGATIVE NEGATIVE   --/--/O POS (03/10 2046)  Imaging:  No results found.  MAU Course/MDM: I have ordered labs and reviewed results. Urine normal  NST reviewed, reactive.  Treatments in MAU included Flexeril which helped back pain somewhat.   I think the majority of the back and abdominal pain is related to contractions. .    Assessment: Single intrauterine pregnancy at [redacted]w[redacted]d Prodromal uterine contractions Low back pain  Plan: Discharge home Labor precautions and fetal kick counts Follow up in Office for prenatal visits and recheck  Encouraged to return here or to other Urgent Care/ED if she develops worsening of symptoms, increase in pain, fever, or other concerning symptoms.   Pt stable at time of discharge.  Wynelle Bourgeois CNM, MSN Certified Nurse-Midwife 03/21/2019 9:32 PM

## 2019-03-21 NOTE — MAU Note (Signed)
Presents with abd and back pain today. No vag bldg or LOF. Pt with some white d/c, no odor or itching. C/o HA's, no dizziness or visual changes.   Gilmer Mor RN

## 2019-03-22 ENCOUNTER — Ambulatory Visit (INDEPENDENT_AMBULATORY_CARE_PROVIDER_SITE_OTHER): Payer: Self-pay | Admitting: Obstetrics and Gynecology

## 2019-03-22 VITALS — BP 135/86 | HR 97 | Wt 249.6 lb

## 2019-03-22 DIAGNOSIS — Z3A37 37 weeks gestation of pregnancy: Secondary | ICD-10-CM

## 2019-03-22 DIAGNOSIS — O0990 Supervision of high risk pregnancy, unspecified, unspecified trimester: Secondary | ICD-10-CM

## 2019-03-22 DIAGNOSIS — O09299 Supervision of pregnancy with other poor reproductive or obstetric history, unspecified trimester: Secondary | ICD-10-CM

## 2019-03-22 DIAGNOSIS — O26893 Other specified pregnancy related conditions, third trimester: Secondary | ICD-10-CM

## 2019-03-22 DIAGNOSIS — M545 Low back pain: Secondary | ICD-10-CM

## 2019-03-22 DIAGNOSIS — O0993 Supervision of high risk pregnancy, unspecified, third trimester: Secondary | ICD-10-CM

## 2019-03-22 DIAGNOSIS — O3663X0 Maternal care for excessive fetal growth, third trimester, not applicable or unspecified: Secondary | ICD-10-CM

## 2019-03-22 DIAGNOSIS — O368130 Decreased fetal movements, third trimester, not applicable or unspecified: Secondary | ICD-10-CM

## 2019-03-22 DIAGNOSIS — O09293 Supervision of pregnancy with other poor reproductive or obstetric history, third trimester: Secondary | ICD-10-CM

## 2019-03-22 NOTE — Discharge Instructions (Signed)
Parto vaginal  Vaginal Delivery    Parto vaginal significa que usted da a luz empujando al bebé fuera del canal del parto (vagina). Un equipo de proveedores de atención médica la ayudará antes, durante y después del parto vaginal. Las experiencias de los nacimientos son únicas para todas las mujeres, y cada embarazo y las experiencias de nacimiento varían según dónde elija dar a luz.  ¿Qué ocurrirá cuando llegue al centro de parto o al hospital?  Una vez que se inicie el trabajo de parto y haya sido admitida en el hospital o centro de parto, el médico podrá hacer lo siguiente:  · Revisar sus antecedentes de embarazo y cualquier inquietud que usted pueda tener.  · Colocarle una vía intravenosa en una de las venas. Esto se podrá usar para administrarle líquidos y medicamentos.  · Verificar su presión arterial, pulso, temperatura y frecuencia cardíaca (signos vitales).  · Verificar si la bolsa de agua (saco amniótico) se ha roto (ruptura).  · Hablar con usted sobre su plan de nacimiento y analizar las opciones para controlar el dolor.  Monitoreo  Su médico puede monitorear las contracciones (monitoreo uterino) y la frecuencia cardíaca del bebé (monitoreo fetal). Es posible que el monitoreo se necesite realizar:  · Con frecuencia, pero no continuamente (intermitentemente).  · Todo el tiempo o durante largos períodos a la vez (continuamente). El monitoreo continuo puede ser necesario si:  ? Está recibiendo determinados medicamentos, tales como medicamentos para aliviar el dolor o para hacer que las contracciones sean más fuertes.  ? Tiene complicaciones durante el embarazo o el trabajo de parto.  El monitoreo se puede realizar:  · Al colocar un estetoscopio especial o un dispositivo manual de monitoreo en el abdomen o verificar los latidos cardíacos del bebé y comprobar las contracciones.  · Al colocar monitores en el abdomen (monitores externos) para registrar los latidos cardíacos del bebé y la frecuencia y duración de  las contracciones.  · Al colocar monitores dentro del útero a través de la vagina (monitores internos) para registrar los latidos cardíacos del bebé y la frecuencia, duración y fuerza de sus contracciones. Según el tipo de monitor, puede permanecer en el útero o en la cabeza del bebé hasta el nacimiento.  · Telemetría. Se trata de un tipo de monitoreo continuo que se puede realizar con monitores externos o internos. En lugar de tener que permanecer en la cama, usted puede moverse durante la telemetría.  Examen físico  Su médico puede realizar exámenes físicos frecuentes. Esto puede incluir lo siguiente:  · Verificar cómo y dónde el bebé está ubicado en el útero.  · Verificar el cuello uterino para determinar:  ? Si se está afinando o estirando (borrando).  ? Si se está abriendo (dilatando).  ¿Qué sucede durante el trabajo de parto y el parto?    El trabajo de parto y el parto normales se dividen en tres etapas:  Etapa 1  · Esta es la etapa más larga del trabajo de parto.  · Esta etapa puede durar horas o días.  · Durante esta etapa, sentirá contracciones. En general, las contracciones son leves, infrecuentes e irregulares al principio. Se hacen más fuertes, más frecuentes (aproximadamente cada 2 o 3 minutos) y más regulares a medida que avanza en esta etapa.  · Esta etapa finaliza cuando el cuello uterino está completamente dilatado hasta 4 pulgadas (10 cm) y completamente borrado.  Etapa 2  · Esta etapa comienza una vez que el cuello uterino está totalmente borrado y dilatado,   vagina. °· Puede sentir un dolor urente y por estiramiento, especialmente cuando la parte más ancha de la cabeza del bebé pasa a través de la abertura vaginal (coronación). °· Una vez que el bebé nace, el cordón umbilical se pinzará y se cortará. Esto ocurre por lo general después  de un período de 1 a 2 minutos después del parto. °· Colocarán al bebé sobre su pecho desnudo (contacto piel con piel) en una posición erguida y cubierto con una manta abrigada. Observe al bebé para detectar señales de hambre, como el reflejo de búsqueda o succión, y acérquelo al pecho para su primera alimentación. °Etapa 3 °· Esta etapa comienza inmediatamente después del nacimiento del bebé y finaliza después de la expulsión de la placenta. °· Esta etapa puede durar de 5 a 30 minutos. °· Después del nacimiento del bebé, puede sentir contracciones cuando el cuerpo expulsa la placenta y el útero se contrae para controlar la hemorragia. °¿Qué puedo esperar después del trabajo de parto y el parto? °· Una vez que termine el trabajo de parto, se los controlará a usted y al bebé atentamente para tener la seguridad de que ambos estén sanos y listos para ir a casa. Su equipo de atención médica le enseñará cómo cuidarse y cuidar a su bebé. °· Usted y el bebé permanecerán en la misma habitación (cohabitación) durante su estadía en el hospital. Esto estimulará una vinculación temprana y una lactancia exitosa. °· Puede seguir recibiendo líquidos o medicamentos por vía intravenosa. °· Se le controlará y masajeará el útero con regularidad (masaje fúndico). °· Tendrá algo de inflamación y dolor en el abdomen, la vagina y la zona de la piel entre la abertura vaginal y el ano (perineo). °· Si se le realizó una incisión cerca de la vagina (episiotomía) o si ha tenido algún desgarro durante el parto, podrían indicarle que se coloque compresas frías sobre la episiotomía o el desgarro. Esto ayuda a aliviar el dolor y la hinchazón. °· Es posible que le den una botella rociadora para que use cuando vaya al baño para higienizarse. Siga los pasos a continuación para usar la botella rociadora: °? Antes de orinar, llene la botella rociadora con agua tibia. No use agua caliente. °? Después de orinar, mientras aún está sentada en el inodoro,  use la botella rociadora para enjuagar el área alrededor de la uretra y la abertura vaginal. Con esto podrá limpiar cualquier rastro de orina y sangre. °? Llene la botella rociadora con agua limpia cada vez que vaya al baño. °· Es normal tener hemorragia vaginal después del parto. Use un apósito sanitario para el sangrado vaginal y secreción. °Resumen °· Parto vaginal significa que usted dará a luz empujando al bebé fuera del canal del parto (vagina). °· Su médico puede monitorear las contracciones (monitoreo uterino) y la frecuencia cardíaca del bebé (monitoreo fetal). °· Su médico puede realizarle un examen físico. °· El trabajo de parto y el parto normales se dividen en tres etapas. °· Una vez que termina el trabajo de parto, se los controlará a usted y al bebé atentamente hasta que estén listos para ir a casa. °Esta información no tiene como fin reemplazar el consejo del médico. Asegúrese de hacerle al médico cualquier pregunta que tenga. °Document Released: 05/20/2008 Document Revised: 08/17/2017 Document Reviewed: 08/17/2017 °Elsevier Patient Education © 2020 [employer] Inc[employer] ° °

## 2019-03-22 NOTE — Progress Notes (Signed)
   PRENATAL VISIT NOTE  Subjective:  Amy Prince is a 23 y.o. [phone] at [redacted]w[redacted]d being seen today for ongoing prenatal care.  She is currently monitored for the following issues for this low-risk pregnancy and has Supervision of high risk pregnancy, antepartum; H/O pre-eclampsia in prior pregnancy, currently pregnant; H/O preterm delivery, currently pregnant; Language barrier; and Obesity in pregnancy on their problem list.  Patient reports no complaints.  Contractions: Irregular. Vag. Bleeding: None.  Movement: (!) Decreased. Denies leaking of fluid.   The following portions of the patient's history were reviewed and updated as appropriate: allergies, current medications, past family history, past medical history, past social history, past surgical history and problem list.   Objective:   Vitals:   03/22/19 1429  BP: 135/86  Pulse: 97  Weight: 249 lb 9.6 oz (113.2 kg)    Fetal Status: Fetal Heart Rate (bpm): 151 Fundal Height: 40 cm Movement: (!) Decreased  Presentation: Vertex  General:  Alert, oriented and cooperative. Patient is in no acute distress.  Skin: Skin is warm and dry. No rash noted.   Cardiovascular: Normal heart rate noted  Respiratory: Normal respiratory effort, no problems with respiration noted  Abdomen: Soft, gravid, appropriate for gestational age.  Pain/Pressure: Present     Pelvic: Cervical exam performed Dilation: 2 Effacement (%): 60 Station: -3  Extremities: Normal range of motion.  Edema: None  Mental Status: Normal mood and affect. Normal behavior. Normal judgment and thought content.   Assessment and Plan:  Pregnancy: [id number] at [redacted]w[redacted]d  1. Supervision of high risk pregnancy, antepartum  Decreased fetal movement: NST today: Reactive, baseline 125, +15x15 accels, no decels.  Fundal height 40 cm today, [place] for growth ordered.  Likely in early labor, bulging membranes. Discussed MAU if contractions become regular and strong.   2. H/O pre-eclampsia in  prior pregnancy, currently pregnant  Continue BASA BP ok today   Term labor symptoms and general obstetric precautions including but not limited to vaginal bleeding, contractions, leaking of fluid and fetal movement were reviewed in detail with the patient. Please refer to After Visit Summary for other counseling recommendations.   Return in about 1 week (around 03/29/2019) for virtual, also needs [place] for growth ASAP - large for dates.  Future Appointments  Date Time Provider [place]  03/28/2019  3:45 PM [place] 2 WH-MFCUS MFC-US  03/29/2019  3:55 PM Akeila Lana, Artist Pais, NP WOC-WOCA WOC  04/05/2019  3:15 PM Luvenia Redden, PA-C WOC-WOCA WOC  04/11/2019  1:15 PM WOC-WOCA NST WOC-WOCA WOC  04/11/2019  2:35 PM Hillard Danker, Myles Rosenthal, PA-C WOC-WOCA WOC    Noni Saupe, NP

## 2019-03-28 ENCOUNTER — Ambulatory Visit (HOSPITAL_COMMUNITY)
Admission: RE | Admit: 2019-03-28 | Discharge: 2019-03-28 | Disposition: A | Discharge status: Home / Self Care | Payer: Self-pay | Source: Ambulatory Visit | Attending: Obstetrics and Gynecology | Admitting: Obstetrics and Gynecology

## 2019-03-28 ENCOUNTER — Other Ambulatory Visit: Payer: Self-pay

## 2019-03-28 DIAGNOSIS — O283 Abnormal ultrasonic finding on antenatal screening of mother: Secondary | ICD-10-CM

## 2019-03-28 DIAGNOSIS — O99213 Obesity complicating pregnancy, third trimester: Secondary | ICD-10-CM

## 2019-03-28 DIAGNOSIS — O26843 Uterine size-date discrepancy, third trimester: Secondary | ICD-10-CM

## 2019-03-28 DIAGNOSIS — O09293 Supervision of pregnancy with other poor reproductive or obstetric history, third trimester: Secondary | ICD-10-CM

## 2019-03-28 DIAGNOSIS — Z362 Encounter for other antenatal screening follow-up: Secondary | ICD-10-CM

## 2019-03-28 DIAGNOSIS — Z3A38 38 weeks gestation of pregnancy: Secondary | ICD-10-CM

## 2019-03-28 DIAGNOSIS — O3663X0 Maternal care for excessive fetal growth, third trimester, not applicable or unspecified: Secondary | ICD-10-CM | POA: Insufficient documentation

## 2019-03-28 DIAGNOSIS — O0990 Supervision of high risk pregnancy, unspecified, unspecified trimester: Secondary | ICD-10-CM | POA: Insufficient documentation

## 2019-03-28 DIAGNOSIS — O09213 Supervision of pregnancy with history of pre-term labor, third trimester: Secondary | ICD-10-CM

## 2019-03-29 ENCOUNTER — Encounter (HOSPITAL_COMMUNITY): Payer: Self-pay | Admitting: *Deleted

## 2019-03-29 ENCOUNTER — Telehealth: Payer: Self-pay | Admitting: Obstetrics and Gynecology

## 2019-03-29 ENCOUNTER — Other Ambulatory Visit: Payer: Self-pay

## 2019-03-29 ENCOUNTER — Inpatient Hospital Stay (HOSPITAL_COMMUNITY)
Admission: AD | Admit: 2019-03-29 | Discharge: 2019-03-31 | DRG: 807 | Disposition: A | Discharge status: Home / Self Care | Payer: Medicaid Other | Attending: Obstetrics and Gynecology | Admitting: Obstetrics and Gynecology

## 2019-03-29 ENCOUNTER — Ambulatory Visit (INDEPENDENT_AMBULATORY_CARE_PROVIDER_SITE_OTHER): Payer: Self-pay | Admitting: Obstetrics and Gynecology

## 2019-03-29 ENCOUNTER — Encounter (HOSPITAL_COMMUNITY): Payer: Self-pay

## 2019-03-29 ENCOUNTER — Other Ambulatory Visit (HOSPITAL_COMMUNITY): Payer: Self-pay | Admitting: Advanced Practice Midwife

## 2019-03-29 ENCOUNTER — Telehealth (HOSPITAL_COMMUNITY): Payer: Self-pay | Admitting: *Deleted

## 2019-03-29 VITALS — BP 128/89 | HR 82

## 2019-03-29 DIAGNOSIS — Z3A38 38 weeks gestation of pregnancy: Secondary | ICD-10-CM | POA: Diagnosis not present

## 2019-03-29 DIAGNOSIS — O99214 Obesity complicating childbirth: Secondary | ICD-10-CM | POA: Diagnosis present

## 2019-03-29 DIAGNOSIS — Z3689 Encounter for other specified antenatal screening: Secondary | ICD-10-CM | POA: Diagnosis not present

## 2019-03-29 DIAGNOSIS — E669 Obesity, unspecified: Secondary | ICD-10-CM | POA: Diagnosis present

## 2019-03-29 DIAGNOSIS — O0990 Supervision of high risk pregnancy, unspecified, unspecified trimester: Secondary | ICD-10-CM

## 2019-03-29 DIAGNOSIS — O26893 Other specified pregnancy related conditions, third trimester: Secondary | ICD-10-CM | POA: Diagnosis present

## 2019-03-29 DIAGNOSIS — Z789 Other specified health status: Secondary | ICD-10-CM

## 2019-03-29 DIAGNOSIS — O0993 Supervision of high risk pregnancy, unspecified, third trimester: Secondary | ICD-10-CM

## 2019-03-29 DIAGNOSIS — Z20828 Contact with and (suspected) exposure to other viral communicable diseases: Secondary | ICD-10-CM | POA: Diagnosis present

## 2019-03-29 DIAGNOSIS — O99210 Obesity complicating pregnancy, unspecified trimester: Secondary | ICD-10-CM

## 2019-03-29 DIAGNOSIS — O09299 Supervision of pregnancy with other poor reproductive or obstetric history, unspecified trimester: Secondary | ICD-10-CM

## 2019-03-29 LAB — POCT FERN TEST: POCT Fern Test: POSITIVE

## 2019-03-29 MED ORDER — LACTATED RINGERS IV SOLN
500.0000 mL | INTRAVENOUS | Status: DC | PRN
  Administered 2019-03-30: 1000 mL via INTRAVENOUS

## 2019-03-29 MED ORDER — OXYCODONE-ACETAMINOPHEN 5-325 MG PO TABS: 2.0000 | ORAL_TABLET | ORAL | Status: DC | PRN

## 2019-03-29 MED ORDER — ACETAMINOPHEN 325 MG PO TABS
650.0000 mg | ORAL_TABLET | ORAL | Status: DC | PRN
  Administered 2019-03-30: 650 mg via ORAL
  Filled 2019-03-29: qty 2

## 2019-03-29 MED ORDER — FLEET ENEMA 7-19 GM/118ML RE ENEM: 1.0000 | ENEMA | RECTAL | Status: DC | PRN

## 2019-03-29 MED ORDER — OXYTOCIN 40 UNITS IN NORMAL SALINE INFUSION - SIMPLE MED
1.0000 m[IU]/min | INTRAVENOUS | Status: DC
  Filled 2019-03-29: qty 1000

## 2019-03-29 MED ORDER — OXYCODONE-ACETAMINOPHEN 5-325 MG PO TABS: 1.0000 | ORAL_TABLET | ORAL | Status: DC | PRN

## 2019-03-29 MED ORDER — FENTANYL CITRATE (PF) 100 MCG/2ML IJ SOLN
50.0000 ug | INTRAMUSCULAR | Status: DC | PRN
  Administered 2019-03-30 (×2): 100 ug via INTRAVENOUS
  Filled 2019-03-29 (×3): qty 2

## 2019-03-29 MED ORDER — LACTATED RINGERS IV SOLN
INTRAVENOUS | Status: DC
  Administered 2019-03-29: 23:00:00 via INTRAVENOUS

## 2019-03-29 MED ORDER — HYDROXYZINE HCL 50 MG PO TABS: 50.0000 mg | ORAL_TABLET | Freq: Four times a day (QID) | ORAL | Status: DC | PRN

## 2019-03-29 MED ORDER — TERBUTALINE SULFATE 1 MG/ML IJ SOLN: 0.2500 mg | Freq: Once | INTRAMUSCULAR | Status: DC | PRN

## 2019-03-29 MED ORDER — ZOLPIDEM TARTRATE 5 MG PO TABS: 5.0000 mg | ORAL_TABLET | Freq: Every evening | ORAL | Status: DC | PRN

## 2019-03-29 MED ORDER — ONDANSETRON HCL 4 MG/2ML IJ SOLN: 4.0000 mg | Freq: Four times a day (QID) | INTRAMUSCULAR | Status: DC | PRN

## 2019-03-29 MED ORDER — LIDOCAINE HCL (PF) 1 % IJ SOLN: 30.0000 mL | INTRAMUSCULAR | Status: DC | PRN

## 2019-03-29 MED ORDER — OXYTOCIN 40 UNITS IN NORMAL SALINE INFUSION - SIMPLE MED: 2.5000 [IU]/h | INTRAVENOUS | Status: DC

## 2019-03-29 MED ORDER — OXYTOCIN BOLUS FROM INFUSION
500.0000 mL | Freq: Once | INTRAVENOUS | Status: AC
  Administered 2019-03-30: 500 mL via INTRAVENOUS

## 2019-03-29 MED ORDER — LACTATED RINGERS IV SOLN: INTRAVENOUS | Status: DC

## 2019-03-29 MED ORDER — SOD CITRATE-CITRIC ACID 500-334 MG/5ML PO SOLN: 30.0000 mL | ORAL | Status: DC | PRN

## 2019-03-29 NOTE — Progress Notes (Signed)
   TELEHEALTH VIRTUAL OBSTETRICS VISIT ENCOUNTER NOTE  I connected with Amy Prince on 03/29/19 at  3:55 PM EDT by telephone at home and verified that I am speaking with the correct person using two identifiers.   I discussed the limitations, risks, security and privacy concerns of performing an evaluation and management service by telephone and the availability of in person appointments. I also discussed with the patient that there may be a patient responsible charge related to this service. The patient expressed understanding and agreed to proceed.  Subjective:  Amy Prince is a 23 y.o. [phone] at [redacted]w[redacted]d being followed for ongoing prenatal care.  She is currently monitored for the following issues for this low-risk pregnancy and has Supervision of high risk pregnancy, antepartum; H/O pre-eclampsia in prior pregnancy, currently pregnant; H/O preterm delivery, currently pregnant; Language barrier; and Obesity in pregnancy on their problem list.  Patient reports no complaints. Reports fetal movement. Denies any contractions, bleeding or leaking of fluid.   The following portions of the patient's history were reviewed and updated as appropriate: allergies, current medications, past family history, past medical history, past social history, past surgical history and problem list.   Objective:   General:  Alert, oriented and cooperative.   Mental Status: Normal mood and affect perceived. Normal judgment and thought content.  Rest of physical exam deferred due to type of encounter  Assessment and Plan:  Pregnancy: [id number] at [redacted]w[redacted]d  1. Supervision of high risk pregnancy, antepartum  Interpretor used GBS negative BP today:  128/89 pulse 82 MFM US shows prominent loops of large intestine. MFM induction at 39 weeks. Induction Scheduled for 10/12. Patient is aware. + fetal movement.  Reasons to present to MAU discussed with patient.    There are no diagnoses linked to this encounter.  Term labor symptoms and general obstetric precautions including but not limited to vaginal bleeding, contractions, leaking of fluid and fetal movement were reviewed in detail with the patient.  I discussed the assessment and treatment plan with the patient. The patient was provided an opportunity to ask questions and all were answered. The patient agreed with the plan and demonstrated an understanding of the instructions. The patient was advised to call back or seek an in-person office evaluation/go to MAU at [facility] for any urgent or concerning symptoms. Please refer to After Visit Summary for other counseling recommendations.   I provided 10 minutes of non-face-to-face time during this encounter.  No follow-ups on file.  Future Appointments  Date Time Provider [place]  03/31/2019  8:10 AM MC-MAU 1 MC-INDC None  04/02/2019  6:30 AM MC-LD [place] MC-INDC None  04/05/2019  3:15 PM Luvenia Redden, PA-C WOC-WOCA WOC  04/11/2019  1:15 PM WOC-WOCA NST WOC-WOCA WOC  04/11/2019  2:35 PM Hillard Danker, Myles Rosenthal, PA-C WOC-WOCA WOC    Noni Saupe, NP [facility] [employer] [facility] [place] [facility]

## 2019-03-29 NOTE — MAU Note (Signed)
SROM around 2115. Fld was clear. Still leaking some fld. Takes med for high blood pressure. Was at office today but no sve

## 2019-03-29 NOTE — H&P (Signed)
Amy Prince is a 23 y.o. female [phone] with IUP at [redacted]w[redacted]d presenting for r/o ROM. Around 2130, she had a gush of fluid that soaked through to her outer pants and has been leaking small amounts since then. Some mild ctx have started.  Cx was 1cm last exam. [facility] at [facility]   Prenatal History/Complications:  [place] w/MFM:  03/28/19: On today's exam, a very mature appearing (grade 3) placenta  was noted.  There were also very prominent loops of large  intestine noted in the fetus that were full of meconium.  Due to these these ultrasound findings and as her cervix is  favorable for induction, I would recommend that she get  induced at around 39 weeks (in a few days).  Her baby  should be examined after birth by the pediatricians to  determine the significance of the dilated loops of bowel that  are full of meconium.  Specifically anal atresia or meconium  ileus should be ruled out.   Hx Severe preE w/IOL at 36 weeks  Past Medical History: Past Medical History:  Diagnosis Date  . History of pre-eclampsia   . Medical history non-contributory     Past Surgical History: Past Surgical History:  Procedure Laterality Date  . CHOLECYSTECTOMY OPEN  2013    Obstetrical History: OB History    Gravida  3   Para  1   Term      Preterm  1   AB  1   Living  1     SAB  1   TAB      Ectopic      Multiple      Live Births  1            Social History: Social History   Socioeconomic History  . Marital status: Legally Separated    Spouse name: Not on file  . Number of children: Not on file  . Years of education: Not on file  . Highest education level: Not on file  Occupational History  . Not on file  Social Needs  . Financial resource strain: Not on file  . Food insecurity    Worry: Not on file    Inability: Not on file  . Transportation needs    Medical: Not on file    Non-medical: Not on file  Tobacco Use  . Smoking status: Never Smoker  . Smokeless  tobacco: Never Used  Substance and Sexual Activity  . Alcohol use: Never    Frequency: Never  . Drug use: Never  . Sexual activity: Not Currently    Birth control/protection: None  Lifestyle  . Physical activity    Days per week: Not on file    Minutes per session: Not on file  . Stress: Not on file  Relationships  . Social Musician on phone: Patient refused    Gets together: Patient refused    Attends religious service: Patient refused    Active member of club or organization: Patient refused    Attends meetings of clubs or organizations: Patient refused    Relationship status: Patient refused  Other Topics Concern  . Not on file  Social History Narrative  . Not on file    Family History: Family History  Problem Relation Age of Onset  . Kidney disease Father     Allergies: No Known Allergies  Medications Prior to Admission  Medication Sig Dispense Refill Last Dose  . acetaminophen (TYLENOL) 325 MG tablet Take  650 mg by mouth every 6 (six) hours as needed.   Past Week at Unknown time  . aspirin 81 MG chewable tablet Chew 81 mg by mouth daily.   03/29/2019 at Unknown time  . docusate sodium (COLACE) 100 MG capsule Take 1 capsule (100 mg total) by mouth 2 (two) times daily as needed for mild constipation or moderate constipation. 30 capsule 2 03/29/2019 at Unknown time  . Prenatal Vit-Fe Fumarate-FA (PRENATAL MULTIVITAMIN) TABS tablet Take 1 tablet by mouth daily at 12 noon. 30 tablet 11 03/29/2019 at Unknown time  . magnesium hydroxide (MILK OF MAGNESIA) 400 MG/5ML suspension Take 30 mLs by mouth daily as needed for mild constipation or moderate constipation. (Patient not taking: Reported on 02/02/2019) 355 mL 2   . miconazole (MONISTAT 7) 2 % vaginal cream Place 1 Applicatorful vaginally at bedtime. Apply for seven nights 30 g 2         Review of Systems   Constitutional: Negative for fever and chills Eyes: Negative for visual disturbances Respiratory:  Negative for shortness of breath, dyspnea Cardiovascular: Negative for chest pain or palpitations  Gastrointestinal: Negative for vomiting, diarrhea and constipation.  POSITIVE for abdominal pain (contractions) Genitourinary: Negative for dysuria and urgency Musculoskeletal: Negative for back pain, joint pain, myalgias  Neurological: Negative for dizziness and headaches      Blood pressure 126/88, pulse 88, temperature 98.5 F (36.9 C), resp. rate 18, height 5' 5.5" (1.664 m), weight 114.8 kg, last menstrual period 07/03/2018. General appearance: alert, appears stated age and no distress Lungs: normal respiratory effort Heart: regular rate and rhythm Abdomen: soft, non-tender; bowel sounds normal Pelvic:  SSE:  + pooling w/clear fluid, fern + Extremities: Homans sign is negative, no sign of DVT DTR's 2+ Presentation: cephalic Fetal monitoring  Baseline: 150 bpm, Variability: Good {> 6 bpm), Accelerations: Reactive and Decelerations: Absent Uterine activity  Mild, infrequent Dilation: 3 Effacement (%): 90 Station: -2 Exam by:: Tonye BecketFran Cresh-Dish, CNM    Nursing Staff Provider  Office Location  [facility] Dating   LMP c /w8 wk US  Language  Spanish Anatomy US   normal  Flu Vaccine   Genetic Screen    Quad: neg   TDaP vaccine  01/19/19  Hgb A1C or  GTT Early 1 hr wnl Third trimester 2 hr GTT nml  Rhogam  n/a   LAB RESULTS   Feeding Plan Breast Blood Type --/--/O POS (03/10 2046)   Contraception Undecided/Pills Antibody  negative  Circumcision no Rubella  immune  Pediatrician  List Given RPR Non Reactive (03/10 2038)   Support Person Lurena JoinerRebecca HBsAg   non-reactive  Prenatal Classes  HIV Non Reactive (03/10 2038)  BTL Consent  GBS  neg  VBAC Consent  Pap  Normal at OSH    Hgb Electro   normal    CF  negative    SMA     Waterbirth  [ ]  Class [ ]  Consent [ ]  CNM visit      Results for orders placed or performed during the hospital encounter of 03/29/19 (from the past 24  hour(s))  Fern Test   Collection Time: 03/29/19 11:07 PM  Result Value Ref Range   POCT Fern Test Positive = ruptured amniotic membanes     Assessment: Amy Prince is a 23 y.o. [id number] with an IUP at 3675w3d presenting for ROM/early labor  Plan: #Labor: expectant management; if ctx haven't picked up 8 hrs post ROM (pt preference), will IOL w/pitocin #Pain:  Per request #  FWB Cat 1 #ID: GBS: neg     Christin Fudge 03/29/2019, 11:12 PM

## 2019-03-29 NOTE — Telephone Encounter (Signed)
[id number] interpreter number  Preadmission screen

## 2019-03-29 NOTE — H&P (Addendum)
LABOR AND DELIVERY ADMISSION HISTORY AND PHYSICAL NOTE  Amy Prince is a 23 y.o. female [phone] with IUP at [redacted]w[redacted]d by LMP via 8 week ultrasound presenting with spontaneous onset of labor. Possible SROM at 2115 with clear fluid - Fern positive. IOL planned at 39 weeks for dilated loops of bowel with heavy burden of meconium. History of IOL at 36 weeks for severe PEC She reports positive fetal movement. She denies leakage of fluid or vaginal bleeding.  Prenatal History/Complications: PNC at [facility] Pregnancy complications:  - History of IOL at 36 weeks for severe PEC - Language barrier - maternal obesity  Past Medical History: Past Medical History:  Diagnosis Date  . History of pre-eclampsia   . Medical history non-contributory     Past Surgical History: Past Surgical History:  Procedure Laterality Date  . CHOLECYSTECTOMY OPEN  2013    Obstetrical History: OB History    Gravida  3   Para  1   Term      Preterm  1   AB  1   Living  1     SAB  1   TAB      Ectopic      Multiple      Live Births  1           Social History: Social History   Socioeconomic History  . Marital status: Legally Separated    Spouse name: Not on file  . Number of children: Not on file  . Years of education: Not on file  . Highest education level: Not on file  Occupational History  . Not on file  Social Needs  . Financial resource strain: Not on file  . Food insecurity    Worry: Not on file    Inability: Not on file  . Transportation needs    Medical: Not on file    Non-medical: Not on file  Tobacco Use  . Smoking status: Never Smoker  . Smokeless tobacco: Never Used  Substance and Sexual Activity  . Alcohol use: Never    Frequency: Never  . Drug use: Never  . Sexual activity: Not Currently    Birth control/protection: None  Lifestyle  . Physical activity    Days per week: Not on file    Minutes per session: Not on file  . Stress: Not on file   Relationships  . Social Herbalist on phone: Patient refused    Gets together: Patient refused    Attends religious service: Patient refused    Active member of club or organization: Patient refused    Attends meetings of clubs or organizations: Patient refused    Relationship status: Patient refused  Other Topics Concern  . Not on file  Social History Narrative  . Not on file    Family History: Family History  Problem Relation Age of Onset  . Kidney disease Father     Allergies: No Known Allergies  Medications Prior to Admission  Medication Sig Dispense Refill Last Dose  . acetaminophen (TYLENOL) 325 MG tablet Take 650 mg by mouth every 6 (six) hours as needed.   Past Week at Unknown time  . aspirin 81 MG chewable tablet Chew 81 mg by mouth daily.   03/29/2019 at Unknown time  . docusate sodium (COLACE) 100 MG capsule Take 1 capsule (100 mg total) by mouth 2 (two) times daily as needed for mild constipation or moderate constipation. 30 capsule 2 03/29/2019 at Unknown time  .  Prenatal Vit-Fe Fumarate-FA (PRENATAL MULTIVITAMIN) TABS tablet Take 1 tablet by mouth daily at 12 noon. 30 tablet 11 03/29/2019 at Unknown time  . magnesium hydroxide (MILK OF MAGNESIA) 400 MG/5ML suspension Take 30 mLs by mouth daily as needed for mild constipation or moderate constipation. (Patient not taking: Reported on 02/02/2019) 355 mL 2   . miconazole (MONISTAT 7) 2 % vaginal cream Place 1 Applicatorful vaginally at bedtime. Apply for seven nights 30 g 2      Review of Systems  All systems reviewed and negative except as stated in HPI  Physical Exam Blood pressure 126/88, pulse 88, temperature 98.5 F (36.9 C), resp. rate 18, height 5' 5.5" (1.664 m), weight 114.8 kg, last menstrual period 07/03/2018. General appearance: alert, oriented, NAD Lungs: normal respiratory effort Heart: regular rate Abdomen: soft, non-tender; gravid, FH appropriate for GA Extremities: No calf swelling or  tenderness Presentation: cephalic Fetal monitoring: baseline rate 130, moderate variability, + acels, no decels Uterine activity: Difficult to assess Dilation: 3 Effacement (%): 90 Station: -2 Exam by:: Tonye Becket, CNM  Prenatal labs: ABO, Rh: --/--/O POS (03/10 2046) Antibody:  Negative Rubella:  Immune RPR: Non Reactive (07/31 0833)  HBsAg:   Non-reactive HIV: Non Reactive (07/31 0833)  GC/Chlamydia: pending GBS: Negative/-- (09/23 1141)  2-hr GTT: Normal Genetic screening:  Quad: neg Anatomy US: Very mature appearing (Grade 3) placenta, very prominent loops of large intestine in fetus full of meconium   Prenatal Transfer Tool  Maternal Diabetes: No Genetic Screening: Normal Maternal Ultrasounds/Referrals: Other: Very mature appearing (Grade 3) placenta, very prominent loops of large intestine in fetus full of meconium Fetal Ultrasounds or other Referrals:  None Maternal Substance Abuse:  No Significant Maternal Medications:  Meds include: Other: Aspirin Significant Maternal Lab Results: Group B Strep negative  No results found for this or any previous visit (from the past 24 hour(s)).  Patient Active Problem List   Diagnosis Date Noted  . Obesity in pregnancy 11/23/2018  . Supervision of high risk pregnancy, antepartum 11/08/2018  . H/O pre-eclampsia in prior pregnancy, currently pregnant 11/08/2018  . H/O preterm delivery, currently pregnant 11/08/2018  . Language barrier 11/08/2018    Assessment: Amy Prince is a 23 y.o. [id number] at [redacted]w[redacted]d here for presenting with spontaneous onset of labor. Possible SROM at 2115 with clear fluid - Fern positive. IOL planned at 39 weeks for dilated loops of bowel with heavy burden of meconium. History of IOL at 36 weeks for severe PEC.  #Labor: SROM at 2215 plus signs of cervical change since last check a few days ago. Will continue expectant management with plan to recheck 4 hours after last check. If no cervical change will  start Pitocin, if adequate cervical change will continue expectant management. #Pain: Desires natural birth. Epidural and IV pain meds upon request #FWB: Appears Cat I #ID:  GBS negative, GC/Cl culture pending  Heavy Fetal Stool Burden: Ultrasound from 10/7 notable for very prominent loops of large intestine noted in the fetus that were full of meconium. MFM recommended induction of labor at 39 weeks with evaluation after birth by pediatrician to examine significance of dilated bowels and rule out anal atresia vs meconium ileus.  History of Pre-E: Blood pressures normotensive since admission. No home anti-hypertensives.  Last UA on 9/30 without proteinuria. - continue to monitor blood pressures  Postpartum Plans: - boy (no) / Both / POPs - [x]  TDAP, [none] Flu  Kiersten P Mullis 03/29/2019, 11:08 PM

## 2019-03-29 NOTE — Telephone Encounter (Signed)
Recent US from MFM shows dilated bowel in fetus. MFM recommends delivery @ 39 weeks. The patient was called using the spanish interpretor and induction is scheduled 10/12 Am. The patient was made aware. All questions answered.    Lezlie Lye, NP 03/29/2019 2:56 PM

## 2019-03-29 NOTE — Progress Notes (Signed)
Noni Saupe, NP requested to have pt scheduled for IOL for dilated bowel per recommendation of MFM @ 39 wks.  IOL faxed to L&D and received successful transmission.    Mel Almond, RN 03/29/19

## 2019-03-30 ENCOUNTER — Encounter (HOSPITAL_COMMUNITY): Payer: Self-pay

## 2019-03-30 DIAGNOSIS — Z3A38 38 weeks gestation of pregnancy: Secondary | ICD-10-CM

## 2019-03-30 LAB — SARS CORONAVIRUS 2 BY RT PCR (HOSPITAL ORDER, PERFORMED IN ~~LOC~~ HOSPITAL LAB): SARS Coronavirus 2: NEGATIVE

## 2019-03-30 LAB — TYPE AND SCREEN
ABO/RH(D): O POS
Antibody Screen: NEGATIVE

## 2019-03-30 LAB — CBC
HCT: 37.5 % (ref 36.0–46.0)
Hemoglobin: 12.8 g/dL (ref 12.0–15.0)
MCH: 30 pg (ref 26.0–34.0)
MCHC: 34.1 g/dL (ref 30.0–36.0)
MCV: 87.8 fL (ref 80.0–100.0)
Platelets: 252 10*3/uL (ref 150–400)
RBC: 4.27 MIL/uL (ref 3.87–5.11)
RDW: 13.7 % (ref 11.5–15.5)
WBC: 8.5 10*3/uL (ref 4.0–10.5)
nRBC: 0 % (ref 0.0–0.2)

## 2019-03-30 LAB — RPR: RPR Ser Ql: NONREACTIVE

## 2019-03-30 MED ORDER — MISOPROSTOL 200 MCG PO TABS
800.0000 ug | ORAL_TABLET | Freq: Once | ORAL | Status: AC
  Administered 2019-03-30: 800 ug via RECTAL

## 2019-03-30 MED ORDER — DOCUSATE SODIUM 100 MG PO CAPS
100.0000 mg | ORAL_CAPSULE | Freq: Two times a day (BID) | ORAL | Status: DC
  Administered 2019-03-30 – 2019-03-31 (×2): 100 mg via ORAL
  Filled 2019-03-30 (×2): qty 1

## 2019-03-30 MED ORDER — CEFAZOLIN SODIUM-DEXTROSE 1-4 GM/50ML-% IV SOLN
1.0000 g | Freq: Three times a day (TID) | INTRAVENOUS | Status: DC
  Filled 2019-03-30 (×2): qty 50

## 2019-03-30 MED ORDER — PRENATAL MULTIVITAMIN CH
1.0000 | ORAL_TABLET | Freq: Every day | ORAL | Status: DC
  Administered 2019-03-30 – 2019-03-31 (×2): 1 via ORAL
  Filled 2019-03-30 (×2): qty 1

## 2019-03-30 MED ORDER — LACTATED RINGERS IV BOLUS: 1000.0000 mL | Freq: Once | INTRAVENOUS | Status: DC

## 2019-03-30 MED ORDER — METHYLERGONOVINE MALEATE 0.2 MG PO TABS: 0.2000 mg | ORAL_TABLET | ORAL | Status: DC | PRN

## 2019-03-30 MED ORDER — BISACODYL 10 MG RE SUPP: 10.0000 mg | Freq: Every day | RECTAL | Status: DC | PRN

## 2019-03-30 MED ORDER — COCONUT OIL OIL: 1.0000 "application " | TOPICAL_OIL | Status: DC | PRN

## 2019-03-30 MED ORDER — METHYLERGONOVINE MALEATE 0.2 MG/ML IJ SOLN
INTRAMUSCULAR | Status: AC
  Filled 2019-03-30: qty 1

## 2019-03-30 MED ORDER — MEASLES, MUMPS & RUBELLA VAC IJ SOLR: 0.5000 mL | Freq: Once | INTRAMUSCULAR | Status: DC

## 2019-03-30 MED ORDER — CEFAZOLIN SODIUM-DEXTROSE 1-4 GM/50ML-% IV SOLN
1.0000 g | Freq: Once | INTRAVENOUS | Status: AC
  Administered 2019-03-30: 1 g via INTRAVENOUS
  Filled 2019-03-30: qty 50

## 2019-03-30 MED ORDER — ONDANSETRON HCL 4 MG PO TABS: 4.0000 mg | ORAL_TABLET | ORAL | Status: DC | PRN

## 2019-03-30 MED ORDER — FERROUS SULFATE 325 (65 FE) MG PO TABS
325.0000 mg | ORAL_TABLET | Freq: Two times a day (BID) | ORAL | Status: DC
  Administered 2019-03-30 – 2019-03-31 (×2): 325 mg via ORAL
  Filled 2019-03-30 (×2): qty 1

## 2019-03-30 MED ORDER — TETANUS-DIPHTH-ACELL PERTUSSIS 5-2.5-18.5 LF-MCG/0.5 IM SUSP: 0.5000 mL | Freq: Once | INTRAMUSCULAR | Status: DC

## 2019-03-30 MED ORDER — FENTANYL CITRATE (PF) 100 MCG/2ML IJ SOLN
50.0000 ug | Freq: Once | INTRAMUSCULAR | Status: AC
  Administered 2019-03-30: 50 ug via INTRAVENOUS

## 2019-03-30 MED ORDER — DIPHENHYDRAMINE HCL 25 MG PO CAPS: 25.0000 mg | ORAL_CAPSULE | Freq: Four times a day (QID) | ORAL | Status: DC | PRN

## 2019-03-30 MED ORDER — METHYLERGONOVINE MALEATE 0.2 MG/ML IJ SOLN
0.2000 mg | Freq: Once | INTRAMUSCULAR | Status: AC
  Administered 2019-03-30: 0.2 mg via INTRAMUSCULAR

## 2019-03-30 MED ORDER — WITCH HAZEL-GLYCERIN EX PADS: 1.0000 "application " | MEDICATED_PAD | CUTANEOUS | Status: DC | PRN

## 2019-03-30 MED ORDER — BENZOCAINE-MENTHOL 20-0.5 % EX AERO: 1.0000 "application " | INHALATION_SPRAY | CUTANEOUS | Status: DC | PRN

## 2019-03-30 MED ORDER — MISOPROSTOL 200 MCG PO TABS
ORAL_TABLET | ORAL | Status: AC
  Filled 2019-03-30: qty 4

## 2019-03-30 MED ORDER — SIMETHICONE 80 MG PO CHEW: 80.0000 mg | CHEWABLE_TABLET | ORAL | Status: DC | PRN

## 2019-03-30 MED ORDER — FLEET ENEMA 7-19 GM/118ML RE ENEM: 1.0000 | ENEMA | Freq: Every day | RECTAL | Status: DC | PRN

## 2019-03-30 MED ORDER — METHYLERGONOVINE MALEATE 0.2 MG/ML IJ SOLN: 0.2000 mg | INTRAMUSCULAR | Status: DC | PRN

## 2019-03-30 MED ORDER — ZOLPIDEM TARTRATE 5 MG PO TABS: 5.0000 mg | ORAL_TABLET | Freq: Every evening | ORAL | Status: DC | PRN

## 2019-03-30 MED ORDER — ONDANSETRON HCL 4 MG/2ML IJ SOLN: 4.0000 mg | INTRAMUSCULAR | Status: DC | PRN

## 2019-03-30 MED ORDER — DIBUCAINE (PERIANAL) 1 % EX OINT: 1.0000 "application " | TOPICAL_OINTMENT | CUTANEOUS | Status: DC | PRN

## 2019-03-30 MED ORDER — LACTATED RINGERS IV SOLN: INTRAVENOUS | Status: DC

## 2019-03-30 MED ORDER — ACETAMINOPHEN 325 MG PO TABS: 650.0000 mg | ORAL_TABLET | ORAL | Status: DC | PRN

## 2019-03-30 MED ORDER — IBUPROFEN 600 MG PO TABS
600.0000 mg | ORAL_TABLET | Freq: Four times a day (QID) | ORAL | Status: DC
  Administered 2019-03-30 – 2019-03-31 (×5): 600 mg via ORAL
  Filled 2019-03-30 (×5): qty 1

## 2019-03-30 NOTE — Discharge Summary (Signed)
Postpartum Discharge Summary       Patient Name: Amy Prince DOB: 24-Dec-1995 MRN: [id number]  Date of admission: 03/29/2019 Delivering Provider: Danna Hefty   Date of discharge: 03/31/2019  Admitting diagnosis: water broke Intrauterine pregnancy: [redacted]w[redacted]d    Secondary diagnosis:  Active Problems:   Indication for care in labor or delivery  Additional problems: None     Discharge diagnosis: Term Pregnancy Delivered                                                                                                Post partum procedures:None  Augmentation: none  Complications: postpartum hemorrhage  Hospital course:  Onset of Labor With Vaginal Delivery     23y.o. yo [id number] 315w4das admitted in Latent Labor on 03/29/2019. Patient had an uncomplicated labor course as follows:  Membrane Rupture Time/Date: 9:15 PM ,03/30/2019  2130 03/29/19 Intrapartum Procedures: Episiotomy: None [1]                                         Lacerations:  None [1]  Patient had a delivery of a Viable infant. 03/30/2019  Information for the patient's newborn:  EsKhalidah, Herbold0[[id number]]Delivery Method: Vaginal, Spontaneous(Filed from Delivery Summary)     Pateint had an uncomplicated postpartum course.  She is ambulating, tolerating a regular diet, passing flatus, and urinating well. Patient is discharged home in stable condition on 03/31/19.  Delivery time: 7:06 AM    Magnesium Sulfate received: No BMZ received: No Rhophylac:No MMR:No Transfusion:No  Physical exam  Vitals:   03/30/19 1431 03/30/19 1812 03/30/19 2215 03/31/19 0530  BP: 105/73 120/75 108/70 108/67  Pulse: 79 81 92 82  Resp:   18 18  Temp: 98.6 F (37 C) 98.2 F (36.8 C) 98.4 F (36.9 C) 98.1 F (36.7 C)  TempSrc: Oral  Oral Oral  SpO2: 99% 99% 99% 100%  Weight:      Height:       General: alert, cooperative and no distress Lochia: appropriate Uterine Fundus: firm Incision: N/A DVT  Evaluation: No evidence of DVT seen on physical exam. Labs: Lab Results  Component Value Date   WBC 14.0 (H) 03/30/2019   HGB 10.2 (L) 03/30/2019   HCT 30.7 (L) 03/30/2019   MCV 88.7 03/30/2019   PLT 229 03/30/2019   CMP Latest Ref Rng & Units 01/19/2019  Glucose 65 - 99 mg/dL 78  BUN 6 - 20 mg/dL 7  Creatinine 0.57 - 1.00 mg/dL 0.56(L)  Sodium 134 - 144 mmol/L 138  Potassium 3.5 - 5.2 mmol/L 4.0  Chloride 96 - 106 mmol/L 100  CO2 20 - 29 mmol/L 22  Calcium 8.7 - 10.2 mg/dL 9.6  Total Protein 6.0 - 8.5 g/dL 6.8  Total Bilirubin 0.0 - 1.2 mg/dL 0.4  Alkaline Phos 39 - 117 IU/L 150(H)  AST 0 - 40 IU/L 17  ALT 0 - 32 IU/L 22    Discharge instruction: per After Visit Summary  and "Baby and Me Booklet".  After visit meds:  Allergies as of 03/31/2019   No Known Allergies     Medication List    STOP taking these medications   acetaminophen 325 MG tablet Commonly known as: TYLENOL   docusate sodium 100 MG capsule Commonly known as: COLACE   miconazole 2 % vaginal cream Commonly known as: MONISTAT 7   Milk of Magnesia 400 MG/5ML suspension Generic drug: magnesium hydroxide     TAKE these medications   aspirin 81 MG chewable tablet Chew 81 mg by mouth daily.   ibuprofen 800 MG tablet Commonly known as: ADVIL Take 1 tablet (800 mg total) by mouth every 8 (eight) hours as needed.   prenatal multivitamin Tabs tablet Take 1 tablet by mouth daily at 12 noon.       Diet: routine diet  Activity: Advance as tolerated. Pelvic rest for 6 weeks.   Outpatient follow up:4 weeks Follow up Appt: No future appointments. Follow up Visit:   Please schedule this patient for Postpartum visit in: 4 weeks with the following provider: Any provider For C/S patients schedule nurse incision check in weeks 2 weeks: no Low risk pregnancy complicated by:  Delivery mode:  SVD Anticipated Birth Control:  POPs PP Procedures needed:   Schedule Integrated BH visit:  no      Newborn Data: Live born female  Birth Weight:   APGAR: ,   Newborn Delivery   Birth date/time: 03/30/2019 07:06:00 Delivery type:       Baby Feeding: Bottle and Breast Disposition:home with mother   Int# [phone] used for this encounter  Marcille Buffy DNP, CNM  03/31/19  11:34 AM

## 2019-03-30 NOTE — Progress Notes (Addendum)
LABOR PROGRESS NOTE  Amy Prince is a 23 y.o. [id number] at [redacted]w[redacted]d  admitted for spontaneous onset of labor with SROM at 2115.   Subjective: Patient sitting up on couch looking at phone. Denies any concerns or complaints. Endorses mild contractions.   Objective: BP 118/74   Pulse 89   Temp 98.4 F (36.9 C) (Oral)   Resp 18   Ht 5' 5.5" (1.664 m)   Wt 114.8 kg   LMP 07/03/2018   BMI 41.46 kg/m  or  Vitals:   03/29/19 2348 03/30/19 0113 03/30/19 0201 03/30/19 0300  BP: 138/78 129/75 111/67 118/74  Pulse: 80 82 82 89  Resp:      Temp: 98.8 F (37.1 C)  98.4 F (36.9 C)   TempSrc:   Oral   Weight:      Height:        Dilation: 4 Effacement (%): 90 Station: -2, -1 Presentation: Vertex Exam by:: Dr. Tarry Kos FHT: baseline rate 15, moderate varibility, + acel, none decel Toco: q 5-6 mins  Labs: Lab Results  Component Value Date   WBC 8.5 03/29/2019   HGB 12.8 03/29/2019   HCT 37.5 03/29/2019   MCV 87.8 03/29/2019   PLT 252 03/29/2019    Patient Active Problem List   Diagnosis Date Noted  . Indication for care in labor or delivery 03/29/2019  . Obesity in pregnancy 11/23/2018  . Supervision of high risk pregnancy, antepartum 11/08/2018  . H/O pre-eclampsia in prior pregnancy, currently pregnant 11/08/2018  . H/O preterm delivery, currently pregnant 11/08/2018  . Language barrier 11/08/2018    Assessment / Plan: 23 y.o. [id number] at [redacted]w[redacted]d here presenting with spontaneous onset of labor.   Labor: Cervical change minimal since admission. Discussed options with patient and she opted to wait on Pitocin. Will plan for cervical check in 4 hours and discuss pitocin vs expectant management at that time Fetal Wellbeing:  Cat I Pain Control:  Epidural / IV pain meds upon request Anticipated MOD:  NSVD  Mina Marble, D.O. Dasher, PGY2 03/30/2019, 3:54 AM

## 2019-03-30 NOTE — Progress Notes (Signed)
Vitals:   03/30/19 0827 03/30/19 0831  BP: 118/69 129/71  Pulse: (!) 102 96  Resp:    Temp:     Called to room for increased bleeding.  1400cc clots removed from LUS.  Pt was given IV fentanyl, 870mcg of cytotec and IV had to be restarted (additional pitocin bolus/ivf given).  Basic problem was uterine atony.  Methergine 0.2mg  given IM and bladder emptied via cath.  PPH protocol initialed.

## 2019-03-31 ENCOUNTER — Other Ambulatory Visit (HOSPITAL_COMMUNITY): Payer: Self-pay

## 2019-03-31 LAB — CBC
HCT: 30.7 % — ABNORMAL LOW (ref 36.0–46.0)
Hemoglobin: 10.2 g/dL — ABNORMAL LOW (ref 12.0–15.0)
MCH: 29.5 pg (ref 26.0–34.0)
MCHC: 33.2 g/dL (ref 30.0–36.0)
MCV: 88.7 fL (ref 80.0–100.0)
Platelets: 229 10*3/uL (ref 150–400)
RBC: 3.46 MIL/uL — ABNORMAL LOW (ref 3.87–5.11)
RDW: 13.7 % (ref 11.5–15.5)
WBC: 14 10*3/uL — ABNORMAL HIGH (ref 4.0–10.5)
nRBC: 0 % (ref 0.0–0.2)

## 2019-03-31 MED ORDER — IBUPROFEN 800 MG PO TABS: 800.0000 mg | ORAL_TABLET | Freq: Three times a day (TID) | ORAL | 0 refills | Status: DC | PRN

## 2019-03-31 NOTE — Lactation Note (Signed)
This note was copied from a baby's chart. Lactation Consultation Note  Patient Name: Amy Prince FVCBS'W Date: 03/31/2019 Reason for consult: Initial assessment;Early term 37-38.6wks;Infant weight loss  89 hours old ETI who is being partially BF and formula fed by his mother, she's a P2. Mom is experienced BF, she BF her now 23 y.o for 2 years. Mom reported minimal breast changes during the pregnancy, just some darkening of the nipple/areola complex. When revising hand expression noticed that her breast were space out and palpable glandular tissue was minimal. No colostrum noted when doing hand expression, reassured mom that if she keeps stimulating her milk will be coming in like it did with her first child, but she needed to start hand expressing prior every feeding or pumping.  Mom doesn't have a hand pump at home, and she'll be going home this afternoon as soon as baby's 24 hours screen is ready. [place] issued a hand pump, instructions, cleaning and storage were reviewed as well as milk storage guidelines. Mom is a Spanish speaker, she was also educated on formula supplementation guidelines, she's aware that the amounts will increase every 24 hours and will revise those with baby's doctor on the next appt.  Offered assistance with latch and mom agreed to wake baby up to feed, educated mom on safe sleep because she had baby lying in bed, right next to her. LC took baby STS to mother's left breast and he was able to latch immediately, no audible swallows noted though, but baby had a strong suck with a pattern that was indicative of nutritive sucking. Baby nursed for 20 minutes, LC helped mom to break the latch so he can have his bath, footprints and 24 hour screening.  Reviewed normal newborn behavior, cluster feeding and feeding cues. Baby is at 2% weight loss, reviewed discharge instructions, engorgement prevention and treatment, treatment/prevention for sore nipples and red flags on when to call  baby's pediatrician.   Feeding plan:  1. Encouraged mom to feed baby STS 8-12 times/24 hours or sooner if feeding cues are present 2. Hand expression and spoon feeding were also encouraged 3. Mom will try pumping every time she gives baby a bottle of formula 4. She'll continue supplementing with Gerber Gentle after feedings at the breast as per feeding choice on admission  BF brochure (SP), BF resources (SP) and feeding diary (SP) were reviewed. Mom reported all questions and concerns were answered, she's aware of [place] OP services and will call PRN.  Maternal Data Formula Feeding for Exclusion: Yes Reason for exclusion: Mother's choice to formula and breast feed on admission Has patient been taught Hand Expression?: Yes Does the patient have breastfeeding experience prior to this delivery?: No  Feeding Feeding Type: Breast Fed  LATCH Score Latch: Grasps breast easily, tongue down, lips flanged, rhythmical sucking.  Audible Swallowing: None  Type of Nipple: Everted at rest and after stimulation  Comfort (Breast/Nipple): Soft / non-tender  Hold (Positioning): Assistance needed to correctly position infant at breast and maintain latch.  LATCH Score: 7  Interventions Interventions: Breast feeding basics reviewed;Assisted with latch;Skin to skin;Breast massage;Hand express;Breast compression;Adjust position;Support pillows;Hand pump  Lactation Tools Discussed/Used Tools: Pump Breast pump type: Manual WIC Program: Yes Pump Review: Setup, frequency, and cleaning;Milk Storage Initiated by:: MPeck Date initiated:: 03/31/19   Consult Status Consult Status: Complete Date: 03/31/19 Follow-up type: Call as needed    Sahvannah Rieser S Bria Portales 03/31/2019, 12:10 PM

## 2019-03-31 NOTE — Lactation Note (Signed)
This note was copied from a baby's chart. Lactation Consultation Note  Patient Name: Amy Prince ZSMOL'M Date: 03/31/2019  P2, 53 hour female infant. LC entered room mom and infant asleep.   Maternal Data    Feeding    LATCH Score                   Interventions    Lactation Tools Discussed/Used     Consult Status      Vicente Serene 03/31/2019, 5:20 AM

## 2019-04-02 ENCOUNTER — Inpatient Hospital Stay (HOSPITAL_COMMUNITY): Payer: Self-pay

## 2019-04-02 ENCOUNTER — Inpatient Hospital Stay (HOSPITAL_COMMUNITY): Admission: AD | Admit: 2019-04-02 | Payer: Self-pay | Source: Home / Self Care | Admitting: Obstetrics and Gynecology

## 2019-04-05 ENCOUNTER — Encounter: Payer: Self-pay | Admitting: Medical

## 2019-04-11 ENCOUNTER — Other Ambulatory Visit: Payer: Self-pay

## 2019-04-11 ENCOUNTER — Encounter: Payer: Self-pay | Admitting: Medical

## 2019-05-01 ENCOUNTER — Telehealth (INDEPENDENT_AMBULATORY_CARE_PROVIDER_SITE_OTHER): Payer: Self-pay | Admitting: Student

## 2019-05-01 ENCOUNTER — Other Ambulatory Visit: Payer: Self-pay

## 2019-05-01 MED ORDER — NORGESTIMATE-ETH ESTRADIOL 0.25-35 MG-MCG PO TABS: 1.0000 | ORAL_TABLET | Freq: Every day | ORAL | 11 refills | Status: DC

## 2019-05-01 NOTE — Progress Notes (Signed)
I connected with@ on 05/01/19 at  8:55 AM EST by: My Chart and verified that I am speaking with the correct person using two identifiers.  Patient is located at [facility] and provider is located at [place].     The purpose of this virtual visit is to provide medical care while limiting exposure to the novel coronavirus. I discussed the limitations, risks, security and privacy concerns of performing an evaluation and management service by  and the availability of in person appointments. I also discussed with the patient that there may be a patient responsible charge related to this service. By engaging in this virtual visit, you consent to the provision of healthcare.  Additionally, you authorize for your insurance to be billed for the services provided during this visit.  The patient expressed understanding and agreed to proceed.  The following staff members participated in the virtual visit:  Amy Prince  Post Partum Visit Note Subjective:    [name] is a 23 y.o. [phone] female who presents for a postpartum visit. She is 4 weeks 4 days postpartum following a spontaneous vaginal delivery. I have fully reviewed the prenatal and intrapartum course. The delivery was at 38 weeks 4 days gestational weeks. Outcome: spontaneous vaginal delivery. Anesthesia: none. Postpartum course has been uneventful. Baby's course has been uneventful. Baby is feeding by bottle Dory Horn. Bleeding staining only. Bowel function is normal. Bladder function is normal. Patient is not sexually active. Contraception method is none. Postpartum depression screening: negative.  The following portions of the patient's history were reviewed and updated as appropriate: allergies, current medications, past family history, past medical history, past social history, past surgical history and problem list.  Review of Systems Pertinent items are noted in HPI.   Objective:  There were no vitals filed for this visit. Self-Obtained        Assessment:   Normal  postpartum exam. Pap smear not done at today's visit. Last pap smear and results were . Next pap due unsure.   Plan:    1. Contraception: OCP (estrogen/progesterone). Review of patien'ts BP history postpartum  shows that patient's blood pressure is normal; safe to prescribe COCP.  2. Patient will call [facility] to find out her pap status, and if she needs new one in December. 3. Follow up in PRN or as needed.  12  minutes of non-face-to-face time spent with the patient   Starr Lake, CNM 05/01/2019 9:19 AM

## 2019-06-05 ENCOUNTER — Encounter: Payer: Self-pay | Admitting: General Practice

## 2020-11-20 LAB — OB RESULTS CONSOLE GC/CHLAMYDIA
Chlamydia: NEGATIVE
Gonorrhea: NEGATIVE

## 2020-11-20 LAB — OB RESULTS CONSOLE HGB/HCT, BLOOD
HCT: 39 (ref 29–41)
Hemoglobin: 13.1

## 2020-11-20 LAB — OB RESULTS CONSOLE RPR: RPR: NONREACTIVE

## 2020-11-20 LAB — OB RESULTS CONSOLE ANTIBODY SCREEN: Antibody Screen: NEGATIVE

## 2020-11-20 LAB — OB RESULTS CONSOLE ABO/RH: RH Type: POSITIVE

## 2020-11-20 LAB — OB RESULTS CONSOLE PLATELET COUNT: Platelets: 286

## 2020-11-20 LAB — GLUCOSE, 1 HOUR GESTATIONAL: Glucose, 1 Hour GTT: 109

## 2020-11-21 ENCOUNTER — Other Ambulatory Visit: Payer: Self-pay | Admitting: Nurse Practitioner

## 2020-11-21 DIAGNOSIS — Z3A19 19 weeks gestation of pregnancy: Secondary | ICD-10-CM

## 2020-11-21 DIAGNOSIS — O09299 Supervision of pregnancy with other poor reproductive or obstetric history, unspecified trimester: Secondary | ICD-10-CM

## 2020-11-21 DIAGNOSIS — O09212 Supervision of pregnancy with history of pre-term labor, second trimester: Secondary | ICD-10-CM

## 2020-11-21 DIAGNOSIS — Z363 Encounter for antenatal screening for malformations: Secondary | ICD-10-CM

## 2020-11-21 LAB — CYTOLOGY - PAP: Pap: NEGATIVE

## 2020-12-01 DIAGNOSIS — O09299 Supervision of pregnancy with other poor reproductive or obstetric history, unspecified trimester: Secondary | ICD-10-CM

## 2020-12-11 ENCOUNTER — Other Ambulatory Visit: Payer: Self-pay

## 2020-12-11 ENCOUNTER — Encounter: Payer: Self-pay | Admitting: Obstetrics & Gynecology

## 2020-12-11 ENCOUNTER — Ambulatory Visit (INDEPENDENT_AMBULATORY_CARE_PROVIDER_SITE_OTHER): Payer: Self-pay | Admitting: Obstetrics & Gynecology

## 2020-12-11 VITALS — BP 116/81 | HR 88 | Wt 223.3 lb

## 2020-12-11 DIAGNOSIS — O99210 Obesity complicating pregnancy, unspecified trimester: Secondary | ICD-10-CM

## 2020-12-11 DIAGNOSIS — O30009 Twin pregnancy, unspecified number of placenta and unspecified number of amniotic sacs, unspecified trimester: Secondary | ICD-10-CM | POA: Insufficient documentation

## 2020-12-11 DIAGNOSIS — O09299 Supervision of pregnancy with other poor reproductive or obstetric history, unspecified trimester: Secondary | ICD-10-CM

## 2020-12-11 LAB — POCT URINALYSIS DIP (DEVICE)
Bilirubin Urine: NEGATIVE
Glucose, UA: NEGATIVE mg/dL
Hgb urine dipstick: NEGATIVE
Ketones, ur: NEGATIVE mg/dL
Leukocytes,Ua: NEGATIVE
Nitrite: NEGATIVE
Protein, ur: NEGATIVE mg/dL
Specific Gravity, Urine: 1.015 (ref 1.005–1.030)
Urobilinogen, UA: 0.2 mg/dL (ref 0.0–1.0)
pH: 6.5 (ref 5.0–8.0)

## 2020-12-11 NOTE — Progress Notes (Signed)
  Subjective:    Amy Prince is a [id number] 107w2d being seen today for her first obstetrical visit.  Her obstetrical history is significant for  twin pregnancy . Patient does intend to breast feed. Pregnancy history fully reviewed.  Patient reports no complaints.  Vitals:   12/11/20 1409  BP: 116/81  Pulse: 88  Weight: 223 lb 4.8 oz (101.3 kg)    HISTORY: OB History  Gravida Para Term Preterm AB Living  4 2 1 1 1 2   SAB IAB Ectopic Multiple Live Births  1     0 2    # Outcome Date GA Lbr Len/2nd Weight Sex Delivery Anes PTL Lv  4 Current           3 Term 03/30/19 [redacted]w[redacted]d 02:50 / 00:16 8 lb 8.3 oz (3.865 kg) M Vag-Spont None  LIV  2 SAB 2018 [redacted]w[redacted]d         1 Preterm 11/25/11 [redacted]w[redacted]d  5 lb 11.7 oz (2.6 kg) M Vag-Spont        Birth Comments: induced at 36 weeks for preeclampsia   Past Medical History:  Diagnosis Date   History of pre-eclampsia    Medical history non-contributory    Past Surgical History:  Procedure Laterality Date   CHOLECYSTECTOMY OPEN  2013   Family History  Problem Relation Age of Onset   Kidney disease Father      Exam    Uterus:   20 weeks  Pelvic Exam:    Perineum: deferred           pH:        Adnexa: not evaluated      System: Breast:  Not indicated   Skin: normal coloration and turgor, no rashes    Neurologic: oriented, normal, normal mood   Extremities: normal strength, tone, and muscle mass   HEENT PERRLA       Neck supple   Cardiovascular: regular rate and rhythm   Respiratory:  appears well, vitals normal, no respiratory distress, acyanotic, normal RR, chest clear, no wheezing, crepitations, rhonchi, normal symmetric air entry   Abdomen: soft, non-tender; bowel sounds normal; no masses,  no organomegaly          Assessment:    Pregnancy: 2014 Patient Active Problem List   Diagnosis Date Noted   Twin pregnancy, 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 Prenatal vitamins. Problem list reviewed and updated. Genetic Screening discussed Panorama undecided.  Ultrasound discussed; fetal survey: ordered.  Follow up in 4 weeks. 50% of 30 min visit spent on counseling and coordination of care.  Interpreter E. Royal   11/10/2018 12/11/2020

## 2020-12-11 NOTE — Patient Instructions (Signed)
[employer] ([place]).Twin and Triplet Pregnancy. [place]: [employer] for Principal Financial and [employer] ([place]); 2019.">  Vanetta Mulders mltiple Multiple Pregnancy Cuando una mujer tiene un embarazo mltiple, espera ms de un beb al [employer]. Puede estar embarazada de gemelos, trillizos o ms. En la [employer], los embarazos mltiples son de [place]. Es muy poco frecuente concebir de forma natural trillizos o ms cantidad de bebs. Los embarazos mltiples son ms riesgosos que los [employer] de un solo beb. Una mujer con un embarazo mltiple es ms propensa a Contractor. Cmo ocurre un embarazo mltiple? Un embarazo mltiple ocurre cuando: El cuerpo de la mujer libera ms de un vulo al mismo tiempo y luego cada vulo es fertilizado por un espermatozoide diferente. Este es el tipo de embarazo mltiple ms frecuente. Los gemelos o ms bebs producidos de Intel se denominan fraternos. No son ms parecidos que los hermanos que nacieron de embarazos individuales. Un espermatozoide fertiliza un vulo, que luego se divide en ms de un embrin. Los gemelos o ms bebs producidos de esta manera se denominan idnticos. En este tipo de [employer], los bebs son siempre del mismo sexo y muy parecidos. Quines tienen ms probabilidades de tener un embarazo mltiple? Existen ms probabilidades de Research officer, political party en mujeres que: Realizaron un tratamiento de fertilidad, especialmente si el tratamiento incluy frmacos de fertilidad. Son [place] de [address] [place] de edad. Ya tuvieron cuatro hijos o ms. Tienen antecedentes familiares de embarazos mltiples. Cmo se diagnostica un embarazo mltiple? Un embarazo mltiple se puede diagnosticar en funcin de: Sntomas como los siguientes: Aumento rpido de Walt Disney primeros 3 meses de Psychiatrist (Medical sales representative). Nuseas ms intensas y ms dolor en las mamas a la palpacin de lo que es  tpico en un embarazo de un beb. tero ms grande de lo normal para la etapa del Psychiatrist. Anlisis de [employer] un nivel de gonadotropina corinica humana [facility]) superior al normal. Esta es una hormona producida por el cuerpo al comienzo del Psychiatrist. Una ecografa. Este estudio se [place] para confirmar un Medical illustrator. Qu riesgos trae aparejados el embarazo mltiple? Un embarazo mltiple la pone en un riesgo mayor de ciertos problemas durante o despus del [place]. Esto incluye lo siguiente: Parto de los bebs antes de la fecha de parto (parto prematuro). Un embarazo a trmino comprende un mnimo de 37 semanas. Los bebs nacidos antes de las 37 semanas pueden tener un riesgo mayor de problemas respiratorios, dificultades en la alimentacin, parlisis cerebral y dificultades de aprendizaje. Diabetes. Preeclampsia. Esta es una afeccin grave que provoca presin arterial alta y dolores de cabeza durante Firefighter. Abundante prdida de sangre despus del parto (hemorragia posparto). Depresin posparto. Bebs con bajo peso al nacer. Cmo afectar a mi atencin mdica un embarazo mltiple? El equipo mdico la controlar ms atentamente. Es posible que deba concurrir a visitas prenatales ms frecuentes. [employer], se asegurarn de que ustedest sana y que los bebs crezcan normalmente. Siga estas instrucciones en su casa: Comida y bebida [place] la nutricin. Siga las recomendaciones de su mdico para aumentar de [place]. Es posible que deba aumentar algo de peso extra si el embarazo es mltiple. Coma refrigerios saludables varias veces durante el da. De esta forma, sumar caloras y reducir las nuseas. Beba suficiente lquido como para Pharmacologist la orina de color amarillo plido. Tomar las vitaminas prenatales. Pregntele al mdico qu vitaminas son adecuadas para usted. Actividad Limite las actividades despus de  20 a 24 semanas de embarazo. Descanse con frecuencia. Evite  las [place], [place] ejercicio y el trabajo que exijan mucho esfuerzo. Consulte al mdico cundo debe dejar de Management consultant. Instrucciones generales No consuma ningn producto que contenga nicotina o tabaco, como cigarrillos, cigarrillos electrnicos y tabaco de Theatre manager. Si necesita ayuda para dejar de fumar, consulte al mdico. No beba alcohol ni consuma drogas ilegales. Use los medicamentos de venta libre y los recetados solamente como se lo haya indicado el mdico. Maricela Curet arreglos para recibir ayuda adicional con las tareas de la casa. Concurra a todas las visitas de seguimiento y a todas las visitas prenatales como se lo haya indicado el mdico. Esto es importante. Dnde buscar ms informacin Dietitian and Gynecology (Colegio Estadounidense de Obstetras y Gineclogos): www.acog.org Comunquese con un mdico si: Tiene mareos. Tiene nuseas, vmitos o diarrea que no se calman. Tiene depresin u otras emociones que interfieren con sus actividades normales. Tiene fiebre. Siente dolor al [employer]. Brett Fairy secrecin vaginal con mal olor. Observa ms hinchazn en la cara, las manos, las piernas o los tobillos. Solicite ayuda de inmediato si: Tiene una prdida de lquido por la vagina. Tiene una hemorragia vaginal. Siente calambres en la pelvis, presin en la pelvis o dolor persistente en el abdomen o en la parte baja de la espalda. Tiene contracciones regulares. Tiene dolor de cabeza intenso, con o sin cambios en la visin. Siente falta de aire o Journalist, newspaper. Nota que los bebs se mueven con menos frecuencia o no se mueven en absoluto. Resumen Se habla de embarazo mltiple cuando una mujer espera ms de un beb al [employer]. Un embarazo mltiple la pone en un riesgo ms alto de dar a luz a los bebs antes de la fecha de [place], tener diabetes, preeclampsia, abundante prdida de sangre despus del parto o de que los bebs tengan un bajo peso al  nacer. El mdico la controlar ms atentamente durante su Psychiatrist. Tambin es posible que deba hacer algunos cambios en su estilo de vida durante el [place]. Esto incluye comer ms, limitar sus actividades despus de 20 a 24 semanas de embarazo y Dispensing optician con [place] extra en las tareas de la casa. Concurra a las consultas de control con el mdico segn las instrucciones o si presenta alguna complicacin. Esta informacin no tiene Theme park manager el consejo del mdico. Asegresede hacerle al mdico cualquier pregunta que tenga. Document Revised: 04/03/2019 Document Reviewed: 04/03/2019 Elsevier Patient Education  2022 [employer].

## 2020-12-12 ENCOUNTER — Telehealth: Payer: Self-pay

## 2020-12-12 ENCOUNTER — Inpatient Hospital Stay (HOSPITAL_COMMUNITY)
Admission: AD | Admit: 2020-12-12 | Discharge: 2020-12-12 | Disposition: A | Discharge status: Home / Self Care | Payer: Self-pay | Attending: Obstetrics and Gynecology | Admitting: Obstetrics and Gynecology

## 2020-12-12 ENCOUNTER — Encounter (HOSPITAL_COMMUNITY): Payer: Self-pay | Admitting: Obstetrics and Gynecology

## 2020-12-12 ENCOUNTER — Inpatient Hospital Stay (HOSPITAL_BASED_OUTPATIENT_CLINIC_OR_DEPARTMENT_OTHER): Payer: Self-pay

## 2020-12-12 DIAGNOSIS — O26892 Other specified pregnancy related conditions, second trimester: Secondary | ICD-10-CM | POA: Insufficient documentation

## 2020-12-12 DIAGNOSIS — O4692 Antepartum hemorrhage, unspecified, second trimester: Secondary | ICD-10-CM | POA: Insufficient documentation

## 2020-12-12 DIAGNOSIS — Z7982 Long term (current) use of aspirin: Secondary | ICD-10-CM | POA: Insufficient documentation

## 2020-12-12 DIAGNOSIS — O30002 Twin pregnancy, unspecified number of placenta and unspecified number of amniotic sacs, second trimester: Secondary | ICD-10-CM | POA: Insufficient documentation

## 2020-12-12 DIAGNOSIS — Z3A16 16 weeks gestation of pregnancy: Secondary | ICD-10-CM | POA: Insufficient documentation

## 2020-12-12 DIAGNOSIS — O30009 Twin pregnancy, unspecified number of placenta and unspecified number of amniotic sacs, unspecified trimester: Secondary | ICD-10-CM

## 2020-12-12 DIAGNOSIS — Z79899 Other long term (current) drug therapy: Secondary | ICD-10-CM | POA: Insufficient documentation

## 2020-12-12 DIAGNOSIS — O99891 Other specified diseases and conditions complicating pregnancy: Secondary | ICD-10-CM

## 2020-12-12 DIAGNOSIS — M545 Low back pain, unspecified: Secondary | ICD-10-CM | POA: Insufficient documentation

## 2020-12-12 DIAGNOSIS — M549 Dorsalgia, unspecified: Secondary | ICD-10-CM

## 2020-12-12 DIAGNOSIS — O322XX2 Maternal care for transverse and oblique lie, fetus 2: Secondary | ICD-10-CM

## 2020-12-12 DIAGNOSIS — O30042 Twin pregnancy, dichorionic/diamniotic, second trimester: Secondary | ICD-10-CM

## 2020-12-12 MED ORDER — IBUPROFEN 600 MG PO TABS
600.0000 mg | ORAL_TABLET | Freq: Once | ORAL | Status: AC
  Administered 2020-12-12: 600 mg via ORAL
  Filled 2020-12-12: qty 1

## 2020-12-12 NOTE — Telephone Encounter (Addendum)
-----   Message from Aviva Signs, CNM sent at 12/12/2020  5:14 AM EDT ----- Regarding: need to move up MFM [place] for twins with bleeding Seen for bleeding at 16 wks Has twins  Questionable fused placenta vs mono-di twins [place] sched for July but we need MFM to [place] her probably within the next week to confirm status of placentas  Thanks  Hilda Lias  -----  [place] scheduled with MFM for Friday, December 19, 2020 at 1345. Called pt with [facility] interpreter Mariam ID [phone], new appt information given. Pt reports continued vaginal bleeding. Advised pt to return to MAU for any increased bleeding, pain, or fever per Wynelle Bourgeois, CNM note on 12/12/20.

## 2020-12-12 NOTE — MAU Note (Signed)
Pt presents to MAU c/o vaginal bleeding beginning this evening around 0255 am. Pt woke up with the urge to urinate and saw bright red blood in the toilet and when she wiped. Pt also c/o lower back pain rated 3/10. Pt has not saturated a maxi pad yet, pt has not seen any clots.

## 2020-12-12 NOTE — Addendum Note (Signed)
Addended by: Marjo Bicker on: 12/12/2020 08:52 AM   Modules accepted: Orders

## 2020-12-12 NOTE — MAU Provider Note (Signed)
Chief Complaint:  Vaginal Bleeding   Event Date/Time   First Provider Initiated Contact with Patient 12/12/20 0350     HPI: Amy Prince is a 25 y.o. [id number] at 23w3dwho presents to maternity admissions reporting getting up to void and seeing bright red blood.  Some mild low back pain.  Twins.. She denies LOF, vaginal itching/burning, urinary symptoms, h/a, dizziness, n/v, diarrhea, constipation or fever/chills.   Vaginal Bleeding The patient's primary symptoms include vaginal bleeding. The patient's pertinent negatives include no genital itching, genital lesions, genital odor or pelvic pain. This is a new problem. The current episode started today. The problem occurs intermittently. The problem has been gradually improving. The pain is mild. She is pregnant. Associated symptoms include back pain. Pertinent negatives include no abdominal pain, constipation, diarrhea, dysuria, fever, frequency, nausea or vomiting. The vaginal discharge was bloody. The vaginal bleeding is lighter than menses. She has not been passing clots. She has not been passing tissue. Nothing aggravates the symptoms. She has tried nothing for the symptoms.     RN Note: Pt presents to MAU c/o vaginal bleeding beginning this evening around 0255 am. Pt woke up with the urge to urinate and saw bright red blood in the toilet and when she wiped. Pt also c/o lower back pain rated 3/10. Pt has not saturated a maxi pad yet, pt has not seen any clots  Past Medical History: Past Medical History:  Diagnosis Date   History of pre-eclampsia    Medical history non-contributory     Past obstetric history: OB History  Gravida Para Term Preterm AB Living  4 2 1 1 1 2   SAB IAB Ectopic Multiple Live Births  1     0 2    # Outcome Date GA Lbr Len/2nd Weight Sex Delivery Anes PTL Lv  4 Current           3 Term 03/30/19 [redacted]w[redacted]d 02:50 / 00:16 3865 g M Vag-Spont None  LIV     Complications: Postpartum hemorrhage  2 SAB 2018 [redacted]w[redacted]d          1 Preterm 11/25/11 [redacted]w[redacted]d  2600 g M Vag-Spont None Y LIV     Birth Comments: induced at 36 weeks for preeclampsia     Complications: Preeclampsia    Past Surgical History: Past Surgical History:  Procedure Laterality Date   CHOLECYSTECTOMY OPEN  2013    Family History: Family History  Problem Relation Age of Onset   Kidney disease Father     Social History: Social History   Tobacco Use   Smoking status: Never   Smokeless tobacco: Never  Vaping Use   Vaping Use: Never used  Substance Use Topics   Alcohol use: Never   Drug use: Never    Allergies: No Known Allergies  Meds:  Medications Prior to Admission  Medication Sig Dispense Refill Last Dose   aspirin 81 MG chewable tablet Chew 81 mg by mouth daily.   12/11/2020   Prenatal Vit-Fe Fumarate-FA (PRENATAL MULTIVITAMIN) TABS tablet Take 1 tablet by mouth daily at 12 noon. 30 tablet 11     I have reviewed patient's Past Medical Hx, Surgical Hx, Family Hx, Social Hx, medications and allergies.   ROS:  Review of Systems  Constitutional:  Negative for fever.  Gastrointestinal:  Negative for abdominal pain, constipation, diarrhea, nausea and vomiting.  Genitourinary:  Positive for vaginal bleeding. Negative for dysuria, frequency and pelvic pain.  Musculoskeletal:  Positive for back pain.  Other systems negative  Physical Exam  Patient Vitals for the past 24 hrs:  Temp Temp src Pulse Resp SpO2 Height Weight  12/12/20 0338 98 F (36.7 C) Oral 83 18 99 % 5' 3.5" (1.613 m) 100.5 kg   Constitutional: Well-developed, well-nourished female in no acute distress.  Cardiovascular: normal rate and rhythm Respiratory: normal effort, clear to auscultation bilaterally GI: Abd soft, non-tender, gravid appropriate for gestational age.   No rebound or guarding. MS: Extremities nontender, no edema, normal ROM Neurologic: Alert and oriented x 4.  GU: Neg CVAT.  PELVIC EXAM: Cervix pink, visually closed, without lesion, scant red  discharge, vaginal walls and external genitalia normal  Cervix long and closed.  No CMT  FHT:  175/184   Labs: Results for orders placed or performed in visit on 12/11/20 (from the past 24 hour(s))  POCT urinalysis dip (device)     Status: None   Collection Time: 12/11/20  2:58 PM  Result Value Ref Range   Glucose, UA NEGATIVE NEGATIVE mg/dL   Bilirubin Urine NEGATIVE NEGATIVE   Ketones, ur NEGATIVE NEGATIVE mg/dL   Specific Gravity, Urine 1.015 1.005 - 1.030   Hgb urine dipstick NEGATIVE NEGATIVE   pH 6.5 5.0 - 8.0   Protein, ur NEGATIVE NEGATIVE mg/dL   Urobilinogen, UA 0.2 0.0 - 1.0 mg/dL   Nitrite NEGATIVE NEGATIVE   Leukocytes,Ua NEGATIVE NEGATIVE   O/Positive/-- (06/02 1113)  Imaging:  Limited OB US Placenta Anterior, no previa Diamniotic sacs  Unclear re: placentas, which appeared close together, need further imaging Cervix long  MAU Course/MDM: I have ordered labs and reviewed results.    Treatments in MAU included ibuprofen for low back pain, possible uterine cramping (seen on U) Will recommend pelvic rest and order MFM [place] to assess placentas.    Assessment: Twin IUP at [redacted]w[redacted]d Vaginal bleeding, unclear source Low back pain, ? Cramping No placenta previa  Plan: Discharge home Pelvic rest Preterm Labor precautions and fetal kick counts Follow up in Office for prenatal visits  Message sent to schedule earlier MFM [place]  Encouraged to return if she develops worsening of symptoms, increase in pain, fever, or other concerning symptoms.   Pt stable at time of discharge.  Wynelle Bourgeois CNM, MSN Certified Nurse-Midwife 12/12/2020 3:50 AM

## 2020-12-19 ENCOUNTER — Ambulatory Visit: Payer: Self-pay

## 2020-12-19 ENCOUNTER — Other Ambulatory Visit: Payer: Self-pay

## 2020-12-22 ENCOUNTER — Encounter (HOSPITAL_COMMUNITY): Payer: Self-pay | Admitting: Obstetrics & Gynecology

## 2020-12-22 ENCOUNTER — Other Ambulatory Visit: Payer: Self-pay

## 2020-12-22 ENCOUNTER — Inpatient Hospital Stay (HOSPITAL_COMMUNITY)
Admission: AD | Admit: 2020-12-22 | Discharge: 2020-12-22 | Disposition: A | Discharge status: Home / Self Care | Payer: Self-pay | Attending: Obstetrics & Gynecology | Admitting: Obstetrics & Gynecology

## 2020-12-22 DIAGNOSIS — Z7982 Long term (current) use of aspirin: Secondary | ICD-10-CM | POA: Insufficient documentation

## 2020-12-22 DIAGNOSIS — O4692 Antepartum hemorrhage, unspecified, second trimester: Secondary | ICD-10-CM

## 2020-12-22 DIAGNOSIS — N898 Other specified noninflammatory disorders of vagina: Secondary | ICD-10-CM

## 2020-12-22 DIAGNOSIS — Z3A17 17 weeks gestation of pregnancy: Secondary | ICD-10-CM

## 2020-12-22 DIAGNOSIS — O30001 Twin pregnancy, unspecified number of placenta and unspecified number of amniotic sacs, first trimester: Secondary | ICD-10-CM

## 2020-12-22 DIAGNOSIS — O99891 Other specified diseases and conditions complicating pregnancy: Secondary | ICD-10-CM

## 2020-12-22 DIAGNOSIS — O30002 Twin pregnancy, unspecified number of placenta and unspecified number of amniotic sacs, second trimester: Secondary | ICD-10-CM

## 2020-12-22 DIAGNOSIS — O26892 Other specified pregnancy related conditions, second trimester: Secondary | ICD-10-CM

## 2020-12-22 DIAGNOSIS — M549 Dorsalgia, unspecified: Secondary | ICD-10-CM

## 2020-12-22 LAB — WET PREP, GENITAL
Clue Cells Wet Prep HPF POC: NONE SEEN
Sperm: NONE SEEN
Trich, Wet Prep: NONE SEEN
Yeast Wet Prep HPF POC: NONE SEEN

## 2020-12-22 LAB — URINALYSIS, ROUTINE W REFLEX MICROSCOPIC
Bilirubin Urine: NEGATIVE
Glucose, UA: NEGATIVE mg/dL
Ketones, ur: NEGATIVE mg/dL
Leukocytes,Ua: NEGATIVE
Nitrite: NEGATIVE
Protein, ur: NEGATIVE mg/dL
Specific Gravity, Urine: 1.014 (ref 1.005–1.030)
pH: 7 (ref 5.0–8.0)

## 2020-12-22 MED ORDER — ACETAMINOPHEN 325 MG PO TABS: 650.0000 mg | ORAL_TABLET | ORAL | 0 refills | Status: AC | PRN

## 2020-12-22 NOTE — MAU Provider Note (Signed)
History     CSN: [id number]  Arrival date and time: 12/22/20 1054   Event Date/Time   First Provider Initiated Contact with Patient 12/22/20 1213      Chief Complaint  Patient presents with   Abdominal Pain   Back Pain   Vaginal Discharge   HPI Amy Prince is a 25 y.o. [id number] at [redacted]w[redacted]d who presents to MAU with chief complaints of abdominal pain, low back pain and abnormal vaginal discharge. These are new problems, onset this morning. Patient's pain is suprapubic, does not radiate. Pain score is 4/10. She took Tylenol about 90 min prior to her arrival in MAU and endorses small improvement in her pain score.   Vaginal discharge Patient endorses milky brown vaginal discharge, new onset this morning. She denies vaginal bleeding. She is remote from sexual intercourse.  Low back pain Patient's locus of pain is her SI joint. Pain score 3/10. She denies aggravating or alleviating factors.  Patient is remote from sexual intercourse.  She receives care with [facility].  OB History     Gravida  4   Para  2   Term  1   Preterm  1   AB  1   Living  2      SAB  1   IAB      Ectopic      Multiple  0   Live Births  2           Past Medical History:  Diagnosis Date   History of pre-eclampsia    Medical history non-contributory     Past Surgical History:  Procedure Laterality Date   CHOLECYSTECTOMY OPEN  2013    Family History  Problem Relation Age of Onset   Kidney disease Father     Social History   Tobacco Use   Smoking status: Never   Smokeless tobacco: Never  Vaping Use   Vaping Use: Never used  Substance Use Topics   Alcohol use: Not Currently    Comment: Social   Drug use: Never    Comment: Social    Allergies: No Known Allergies  Medications Prior to Admission  Medication Sig Dispense Refill Last Dose   aspirin 81 MG chewable tablet Chew 81 mg by mouth daily.   12/22/2020   Prenatal Vit-Fe Fumarate-FA (PRENATAL MULTIVITAMIN) TABS  tablet Take 1 tablet by mouth daily at 12 noon. 30 tablet 11 12/21/2020    Review of Systems  Gastrointestinal:  Positive for abdominal pain.  Genitourinary:  Positive for vaginal discharge.  Musculoskeletal:  Positive for back pain.  All other systems reviewed and are negative. Physical Exam   Blood pressure 120/63, pulse 84, temperature 98.3 F (36.8 C), temperature source Oral, resp. rate 19, weight 100.5 kg, last menstrual period 08/19/2020, SpO2 99 %, unknown if currently breastfeeding.  Physical Exam Vitals and nursing note reviewed. Exam conducted with a chaperone present.  Constitutional:      Appearance: She is well-developed. She is obese.  Cardiovascular:     Rate and Rhythm: Normal rate and regular rhythm.     Heart sounds: Normal heart sounds.  Pulmonary:     Effort: Pulmonary effort is normal.     Breath sounds: Normal breath sounds.  Abdominal:     Tenderness: There is no abdominal tenderness. There is no right CVA tenderness or left CVA tenderness.     Comments: Gravid  Genitourinary:    Vagina: Normal.     Cervix: Normal.  Comments: Pelvic exam: External genitalia normal, vaginal walls pink and well rugated, cervix visually closed, no lesions noted. Scant white discharge near posterior fornix   Musculoskeletal:       Back:  Skin:    Capillary Refill: Capillary refill takes less than 2 seconds.  Neurological:     Mental Status: She is alert and oriented to person, place, and time.  Psychiatric:        Mood and Affect: Mood normal.        Behavior: Behavior normal.    MAU Course  Procedures  --Live IUP x 2 visualized on BSUS. FHT Baby A = 150, FHT Baby B = 135 --S/p bleeding event and MAU evaluation on 06/24. Discussed with patient that is recent enough today's discharge could be physiologic removal of previous vaginal bleeding --Patient previously contacted by MFM and reassured of placentation. No MFM evaluation required and 07/01 was canceled by MFM.  Reassurance provided to patient. No indication for accomplishing MFM scan during MAU evaluation     Document Information  Photos  Patient photo 9am 07/04  12/22/2020 12:25    Orders Placed This Encounter  Procedures   Wet prep, genital   Urinalysis, Routine w reflex microscopic Urine, Clean Catch   Discharge patient   Patient Vitals for the past 24 hrs:  BP Temp Temp src Pulse Resp SpO2 Weight  12/22/20 1245 109/64 98.8 F (37.1 C) Oral 91 20 99 % --  12/22/20 1143 120/63 -- -- 84 19 -- --  12/22/20 1109 126/72 98.3 F (36.8 C) Oral 99 20 99 % 100.5 kg   Results for orders placed or performed during the hospital encounter of 12/22/20 (from the past 24 hour(s))  Urinalysis, Routine w reflex microscopic Urine, Clean Catch     Status: Abnormal   Collection Time: 12/22/20 11:26 AM  Result Value Ref Range   Color, Urine YELLOW YELLOW   APPearance CLEAR CLEAR   Specific Gravity, Urine 1.014 1.005 - 1.030   pH 7.0 5.0 - 8.0   Glucose, UA NEGATIVE NEGATIVE mg/dL   Hgb urine dipstick SMALL (A) NEGATIVE   Bilirubin Urine NEGATIVE NEGATIVE   Ketones, ur NEGATIVE NEGATIVE mg/dL   Protein, ur NEGATIVE NEGATIVE mg/dL   Nitrite NEGATIVE NEGATIVE   Leukocytes,Ua NEGATIVE NEGATIVE   RBC / HPF 0-5 0 - 5 RBC/hpf   WBC, UA 0-5 0 - 5 WBC/hpf   Bacteria, UA RARE (A) NONE SEEN   Squamous Epithelial / LPF 6-10 0 - 5  Wet prep, genital     Status: Abnormal   Collection Time: 12/22/20 11:42 AM   Specimen: PATH Cytology Cervicovaginal Ancillary Only  Result Value Ref Range   Yeast Wet Prep HPF POC NONE SEEN NONE SEEN   Trich, Wet Prep NONE SEEN NONE SEEN   Clue Cells Wet Prep HPF POC NONE SEEN NONE SEEN   WBC, Wet Prep HPF POC MODERATE (A) NONE SEEN   Sperm NONE SEEN    Assessment and Plan  --25 y.o. [id number] at [redacted]w[redacted]d  --Twin gestation --FHT x 2 --No acute findings --Blood type O POS --Language barrier: in-person interpreter Angie present for all patient interaction --Discharge home  in stable condition  Calvert Cantor, CNM 12/22/2020, 6:03 PM

## 2020-12-22 NOTE — MAU Note (Signed)
Presents with c/o lower abdominal and back pain the began this morning.  States has brown vaginal discharge with wiping, also started this morning.  Denies LOF.  Denies recent intercourse.

## 2020-12-23 LAB — GC/CHLAMYDIA PROBE AMP (~~LOC~~) NOT AT ARMC
Chlamydia: NEGATIVE
Comment: NEGATIVE
Comment: NORMAL
Neisseria Gonorrhea: NEGATIVE

## 2020-12-30 ENCOUNTER — Other Ambulatory Visit: Payer: Self-pay | Admitting: *Deleted

## 2020-12-30 ENCOUNTER — Other Ambulatory Visit: Payer: Self-pay | Admitting: Advanced Practice Midwife

## 2020-12-30 ENCOUNTER — Ambulatory Visit: Payer: Self-pay | Admitting: *Deleted

## 2020-12-30 ENCOUNTER — Ambulatory Visit (HOSPITAL_BASED_OUTPATIENT_CLINIC_OR_DEPARTMENT_OTHER): Payer: Self-pay | Admitting: Maternal & Fetal Medicine

## 2020-12-30 ENCOUNTER — Other Ambulatory Visit: Payer: Self-pay

## 2020-12-30 ENCOUNTER — Ambulatory Visit: Payer: Self-pay | Attending: Nurse Practitioner

## 2020-12-30 ENCOUNTER — Other Ambulatory Visit: Payer: Self-pay | Admitting: Nurse Practitioner

## 2020-12-30 ENCOUNTER — Encounter: Payer: Self-pay | Admitting: *Deleted

## 2020-12-30 ENCOUNTER — Ambulatory Visit: Payer: Self-pay

## 2020-12-30 VITALS — BP 99/59 | HR 68

## 2020-12-30 DIAGNOSIS — O30042 Twin pregnancy, dichorionic/diamniotic, second trimester: Secondary | ICD-10-CM | POA: Insufficient documentation

## 2020-12-30 DIAGNOSIS — O30009 Twin pregnancy, unspecified number of placenta and unspecified number of amniotic sacs, unspecified trimester: Secondary | ICD-10-CM

## 2020-12-30 DIAGNOSIS — O09212 Supervision of pregnancy with history of pre-term labor, second trimester: Secondary | ICD-10-CM | POA: Insufficient documentation

## 2020-12-30 DIAGNOSIS — Z8759 Personal history of other complications of pregnancy, childbirth and the puerperium: Secondary | ICD-10-CM

## 2020-12-30 DIAGNOSIS — O09299 Supervision of pregnancy with other poor reproductive or obstetric history, unspecified trimester: Secondary | ICD-10-CM

## 2020-12-30 DIAGNOSIS — Z3A19 19 weeks gestation of pregnancy: Secondary | ICD-10-CM | POA: Insufficient documentation

## 2020-12-30 DIAGNOSIS — O4692 Antepartum hemorrhage, unspecified, second trimester: Secondary | ICD-10-CM | POA: Insufficient documentation

## 2020-12-30 DIAGNOSIS — Z363 Encounter for antenatal screening for malformations: Secondary | ICD-10-CM | POA: Insufficient documentation

## 2020-12-30 DIAGNOSIS — O99212 Obesity complicating pregnancy, second trimester: Secondary | ICD-10-CM | POA: Insufficient documentation

## 2020-12-30 DIAGNOSIS — Z3A17 17 weeks gestation of pregnancy: Secondary | ICD-10-CM | POA: Insufficient documentation

## 2020-12-30 DIAGNOSIS — E669 Obesity, unspecified: Secondary | ICD-10-CM

## 2020-12-30 NOTE — Progress Notes (Signed)
MFM Brief Note  [name] is here for a detailed exam due to twin gestational with early vaginal bleeding. Her prior exam in the MAU did not establish dating  she is dated by her LMP.  Today we observed a dating discrepancy of > 10 days therefore we have dated her by today's examination with EDD of 06/09/21 and have converted the exam to a complete given early gestational age.  Limited anatomy appeared within normal for gestational age. There was good amniotic fluid and fetal movement was observed in Twin A and B.  I reviewed the normal nature of today's ultrasound. [name] conveyed that she is taking low dose aspirin for preeclampsia prevention.  We reviewed the sonographic findings and limitations of ultrasound. The potential risks associated with a twin gestation were discussed.  This discussion included a review of the increased risk of miscarriages, anomalies, preterm labor, and/or delivery, malpresentation, delivery via cesarean section, gestational diabetes, and/or preeclampsia.  With regards to fetal risks, there is an increased risk for fetal growth restriction of one or both twins, preterm labor, and associated morbidity, and intrauterine fetal demise.    We recommend growth scans every 4 weeks starting at 24 weeks with the initation of weekly antenatal testing in the form of twice weekly NST or weekly BPP should abnormal fetal growth or intertwin discordance of greater than 20-25% is noted.   [name] has a history of preeclampsia in her prior preegnancy.   Following counseling, all questions were addressed.    Lastly, I discussed the option of noninvasive and invasive testing. She opted to have the cell free DNA drawn today. All questions answered regarding the risk and benefits of both test.  I spent 30 minutes with > 50% in face to face consultation. This interview was also performed with the assistance of an interpreter.  Novella Olive, MD.

## 2020-12-31 ENCOUNTER — Other Ambulatory Visit: Payer: Self-pay | Admitting: Nurse Practitioner

## 2020-12-31 ENCOUNTER — Other Ambulatory Visit: Payer: Self-pay | Admitting: Advanced Practice Midwife

## 2020-12-31 DIAGNOSIS — Z3A17 17 weeks gestation of pregnancy: Secondary | ICD-10-CM

## 2020-12-31 DIAGNOSIS — O30009 Twin pregnancy, unspecified number of placenta and unspecified number of amniotic sacs, unspecified trimester: Secondary | ICD-10-CM

## 2020-12-31 DIAGNOSIS — O99212 Obesity complicating pregnancy, second trimester: Secondary | ICD-10-CM

## 2020-12-31 DIAGNOSIS — O4692 Antepartum hemorrhage, unspecified, second trimester: Secondary | ICD-10-CM

## 2021-01-01 ENCOUNTER — Inpatient Hospital Stay (HOSPITAL_COMMUNITY)
Admission: AD | Admit: 2021-01-01 | Discharge: 2021-01-01 | Disposition: A | Discharge status: Home / Self Care | Payer: Self-pay | Attending: Obstetrics and Gynecology | Admitting: Obstetrics and Gynecology

## 2021-01-01 ENCOUNTER — Encounter (HOSPITAL_COMMUNITY): Payer: Self-pay | Admitting: Obstetrics and Gynecology

## 2021-01-01 DIAGNOSIS — Z3A17 17 weeks gestation of pregnancy: Secondary | ICD-10-CM | POA: Insufficient documentation

## 2021-01-01 DIAGNOSIS — Z0371 Encounter for suspected problem with amniotic cavity and membrane ruled out: Secondary | ICD-10-CM

## 2021-01-01 DIAGNOSIS — O4692 Antepartum hemorrhage, unspecified, second trimester: Secondary | ICD-10-CM

## 2021-01-01 DIAGNOSIS — Z79899 Other long term (current) drug therapy: Secondary | ICD-10-CM | POA: Insufficient documentation

## 2021-01-01 DIAGNOSIS — O09292 Supervision of pregnancy with other poor reproductive or obstetric history, second trimester: Secondary | ICD-10-CM | POA: Insufficient documentation

## 2021-01-01 DIAGNOSIS — Z7982 Long term (current) use of aspirin: Secondary | ICD-10-CM | POA: Insufficient documentation

## 2021-01-01 LAB — WET PREP, GENITAL
Clue Cells Wet Prep HPF POC: NONE SEEN
Sperm: NONE SEEN
Trich, Wet Prep: NONE SEEN
WBC, Wet Prep HPF POC: NONE SEEN
Yeast Wet Prep HPF POC: NONE SEEN

## 2021-01-01 LAB — URINALYSIS, ROUTINE W REFLEX MICROSCOPIC
Bacteria, UA: NONE SEEN
Bilirubin Urine: NEGATIVE
Glucose, UA: NEGATIVE mg/dL
Ketones, ur: NEGATIVE mg/dL
Leukocytes,Ua: NEGATIVE
Nitrite: NEGATIVE
Protein, ur: NEGATIVE mg/dL
Specific Gravity, Urine: 1.015 (ref 1.005–1.030)
pH: 7 (ref 5.0–8.0)

## 2021-01-01 LAB — POCT FERN TEST: POCT Fern Test: NEGATIVE

## 2021-01-01 NOTE — MAU Provider Note (Signed)
History     [id number]  Arrival date and time: 01/01/21 1753    Chief Complaint  Patient presents with   Rupture of Membranes     HPI Amy Prince is a 25 y.o. at [redacted]w[redacted]d by [redacted]w[redacted]d [place] with PMHx notable for pre-eclampsia in prior pregnancy, who presents for possible ROM.   Patient reports that earlier today was lying down and felt a small gush of fluid. She did not think much of it but then it happened again and went a little ways down her leg. No color or odor. Denies vaginal bleeding or contractions. No burning or pain with urination. No vaginal discharge.  O/Positive/-- (06/02 1113)  OB History     Gravida  4   Para  2   Term  1   Preterm  1   AB  1   Living  2      SAB  1   IAB      Ectopic      Multiple  0   Live Births  2           Past Medical History:  Diagnosis Date   History of pre-eclampsia    Medical history non-contributory     Past Surgical History:  Procedure Laterality Date   CHOLECYSTECTOMY OPEN  2013    Family History  Problem Relation Age of Onset   Kidney disease Father     Social History   Socioeconomic History   Marital status: Married    Spouse name: Not on file   Number of children: Not on file   Years of education: Not on file   Highest education level: High school graduate  Occupational History   Not on file  Tobacco Use   Smoking status: Never   Smokeless tobacco: Never  Vaping Use   Vaping Use: Never used  Substance and Sexual Activity   Alcohol use: Not Currently    Comment: Social   Drug use: Never    Comment: Social   Sexual activity: Not Currently    Birth control/protection: Pill  Other Topics Concern   Not on file  Social History Narrative   Not on file   Social Determinants of Health   Financial Resource Strain: Not on file  Food Insecurity: Not on file  Transportation Needs: Not on file  Physical Activity: Not on file  Stress: Not on file  Social Connections: Not on file   Intimate Partner Violence: Not on file    No Known Allergies  No current facility-administered medications on file prior to encounter.   Current Outpatient Medications on File Prior to Encounter  Medication Sig Dispense Refill   acetaminophen (TYLENOL) 325 MG tablet Take 2 tablets (650 mg total) by mouth every 4 (four) hours as needed for moderate pain. 180 tablet 0   aspirin 81 MG chewable tablet Chew 81 mg by mouth daily.     Prenatal Vit-Fe Fumarate-FA (PRENATAL MULTIVITAMIN) TABS tablet Take 1 tablet by mouth daily at 12 noon. 30 tablet 11     ROS Pertinent positives and negative per HPI, all others reviewed and negative  Physical Exam   BP (!) 109/58 (BP Location: Right Arm)   Pulse 84   Temp 98.4 F (36.9 C) (Oral)   Resp 18   Ht 5\' 6"  (1.676 m)   Wt 101 kg   LMP 08/19/2020 (Approximate)   SpO2 99%   BMI 35.94 kg/m   Patient Vitals for the past 24 hrs:  BP Temp Temp src Pulse Resp SpO2 Height Weight  01/01/21 1806 (!) 109/58 98.4 F (36.9 C) Oral 84 18 99 % 5\' 6"  (1.676 m) 101 kg  01/01/21 1804 -- -- -- -- -- 99 % -- --    Physical Exam Vitals reviewed.  Constitutional:      General: She is not in acute distress.    Appearance: She is well-developed. She is not diaphoretic.  Eyes:     General: No scleral icterus. Pulmonary:     Effort: Pulmonary effort is normal. No respiratory distress.  Abdominal:     General: There is no distension.     Palpations: Abdomen is soft.     Tenderness: There is no abdominal tenderness. There is no guarding or rebound.  Genitourinary:    Comments: Cervix visually closed, neg pool, neg fern Skin:    General: Skin is warm and dry.  Neurological:     Mental Status: She is alert.     Coordination: Coordination normal.     Cervical Exam    Bedside Ultrasound Pt informed that the ultrasound is considered a limited OB ultrasound and is not intended to be a complete ultrasound exam.  Patient also informed that the  ultrasound is not being completed with the intent of assessing for fetal or placental anomalies or any pelvic abnormalities.  Explained that the purpose of today's ultrasound is to assess for  AFI.  Patient acknowledges the purpose of the exam and the limitations of the study.     My interpretation: viable twin IUP seen with normal somatic and cardiac movement for both twins. AFI subjectively normal for both twins.   FHT See above  Labs Results for orders placed or performed during the hospital encounter of 01/01/21 (from the past 24 hour(s))  Urinalysis, Routine w reflex microscopic Urine, Clean Catch     Status: Abnormal   Collection Time: 01/01/21  6:30 PM  Result Value Ref Range   Color, Urine YELLOW YELLOW   APPearance HAZY (A) CLEAR   Specific Gravity, Urine 1.015 1.005 - 1.030   pH 7.0 5.0 - 8.0   Glucose, UA NEGATIVE NEGATIVE mg/dL   Hgb urine dipstick SMALL (A) NEGATIVE   Bilirubin Urine NEGATIVE NEGATIVE   Ketones, ur NEGATIVE NEGATIVE mg/dL   Protein, ur NEGATIVE NEGATIVE mg/dL   Nitrite NEGATIVE NEGATIVE   Leukocytes,Ua NEGATIVE NEGATIVE   RBC / HPF 0-5 0 - 5 RBC/hpf   WBC, UA 0-5 0 - 5 WBC/hpf   Bacteria, UA NONE SEEN NONE SEEN   Squamous Epithelial / LPF 11-20 0 - 5  Wet prep, genital     Status: None   Collection Time: 01/01/21  7:13 PM   Specimen: Vaginal  Result Value Ref Range   Yeast Wet Prep HPF POC NONE SEEN NONE SEEN   Trich, Wet Prep NONE SEEN NONE SEEN   Clue Cells Wet Prep HPF POC NONE SEEN NONE SEEN   WBC, Wet Prep HPF POC NONE SEEN NONE SEEN   Sperm NONE SEEN   Fern Test     Status: None   Collection Time: 01/01/21  7:15 PM  Result Value Ref Range   POCT Fern Test Negative = intact amniotic membranes     Imaging No results found.  MAU Course  Procedures Lab Orders  Wet prep, genital  Urinalysis, Routine w reflex microscopic Urine, Clean Catch  Fern Test  No orders of the defined types were placed in this encounter.  Imaging Orders  No  imaging studies ordered today    MDM mild  Assessment and Plan  #Encounter for suspected PROM, PROM not found Ruled out for rupture on exam and fern slide, AFI also subjectively normal on bedside US. Wet prep negative for any infections. Provided reassurance.  Discharge to home in stable condition.    Venora Maples, MD/MPH 01/01/21 8:34 PM  Allergies as of 01/01/2021   No Known Allergies      Medication List     TAKE these medications    acetaminophen 325 MG tablet Commonly known as: Tylenol Take 2 tablets (650 mg total) by mouth every 4 (four) hours as needed for moderate pain.   aspirin 81 MG chewable tablet Chew 81 mg by mouth daily.   prenatal multivitamin Tabs tablet Take 1 tablet by mouth daily at 12 noon.

## 2021-01-01 NOTE — MAU Note (Signed)
Has had 3 times, fluid coming down her leg.  Had not had an urge to pee, so doesn't think it was urine. Denies pain, bleeding or recent intercourse.

## 2021-01-02 ENCOUNTER — Inpatient Hospital Stay (HOSPITAL_COMMUNITY)
Admission: AD | Admit: 2021-01-02 | Discharge: 2021-01-03 | Disposition: A | Discharge status: Home / Self Care | Payer: Self-pay | Attending: Obstetrics and Gynecology | Admitting: Obstetrics and Gynecology

## 2021-01-02 ENCOUNTER — Other Ambulatory Visit: Payer: Self-pay

## 2021-01-02 DIAGNOSIS — O30002 Twin pregnancy, unspecified number of placenta and unspecified number of amniotic sacs, second trimester: Secondary | ICD-10-CM

## 2021-01-02 DIAGNOSIS — Z3A17 17 weeks gestation of pregnancy: Secondary | ICD-10-CM | POA: Insufficient documentation

## 2021-01-02 DIAGNOSIS — Z7982 Long term (current) use of aspirin: Secondary | ICD-10-CM | POA: Insufficient documentation

## 2021-01-02 DIAGNOSIS — Z0371 Encounter for suspected problem with amniotic cavity and membrane ruled out: Secondary | ICD-10-CM | POA: Insufficient documentation

## 2021-01-02 DIAGNOSIS — Z789 Other specified health status: Secondary | ICD-10-CM

## 2021-01-02 DIAGNOSIS — O30042 Twin pregnancy, dichorionic/diamniotic, second trimester: Secondary | ICD-10-CM | POA: Insufficient documentation

## 2021-01-02 DIAGNOSIS — O4692 Antepartum hemorrhage, unspecified, second trimester: Secondary | ICD-10-CM

## 2021-01-02 DIAGNOSIS — O4290 Premature rupture of membranes, unspecified as to length of time between rupture and onset of labor, unspecified weeks of gestation: Secondary | ICD-10-CM

## 2021-01-02 LAB — GC/CHLAMYDIA PROBE AMP (~~LOC~~) NOT AT ARMC
Chlamydia: NEGATIVE
Comment: NEGATIVE
Comment: NORMAL
Neisseria Gonorrhea: NEGATIVE

## 2021-01-02 NOTE — MAU Note (Signed)
PT SPEAKS SPANISH- SILVA- INTERPRETER PT SAYS- WAS HERE YESTERDAY- BC LEAKING FLUID- WHEN HOME MORE LIQUID CAME OUT. PT CALLED CLINIC TODAY- BC HAS NOT FELT BABIES MOVE TODAY  AND FEELS FLUID RUN OUT . LAST TIME FLUID CAME OUT WAS 0400.  YESTERDAY - WHEN COUGH - FLUID CAME OUT  NO PAIN/ NO CRAMPING

## 2021-01-02 NOTE — MAU Provider Note (Signed)
History     CSN: [id number]  Arrival date and time: 01/02/21 2153   Event Date/Time   First Provider Initiated Contact with Patient 01/02/21 2337      Chief Complaint  Patient presents with   Rupture of Membranes   Decreased Fetal Movement   Amy Prince is a 25 y.o. [id number] at [redacted]w[redacted]d who receives care at [facility].  She presents today for Rupture of Membranes and Decreased Fetal Movement.  Patient states she had a large gush of fluid at 0400 that saturated her pad.  She states she has not had any continued leaking, but has been experiencing the same symptom for the past few days. She states after leaving the hospital, the other day, she had a large gush that saturated the majority of the chux pad.  Patient with pictures as below.  Patient states the leaking occurs with coughing, but does not have urine smell.  She also reports it is brown in color.  Patient denies cramping or back pain.  She also denies vaginal bleeding or discharge.    OB History     Gravida  4   Para  2   Term  1   Preterm  1   AB  1   Living  2      SAB  1   IAB      Ectopic      Multiple  0   Live Births  2           Past Medical History:  Diagnosis Date   History of pre-eclampsia    Medical history non-contributory     Past Surgical History:  Procedure Laterality Date   CHOLECYSTECTOMY OPEN  2013    Family History  Problem Relation Age of Onset   Kidney disease Father     Social History   Tobacco Use   Smoking status: Never   Smokeless tobacco: Never  Vaping Use   Vaping Use: Never used  Substance Use Topics   Alcohol use: Not Currently    Comment: Social   Drug use: Never    Comment: Social    Allergies: No Known Allergies  Medications Prior to Admission  Medication Sig Dispense Refill Last Dose   acetaminophen (TYLENOL) 325 MG tablet Take 2 tablets (650 mg total) by mouth every 4 (four) hours as needed for moderate pain. 180 tablet 0    aspirin 81 MG  chewable tablet Chew 81 mg by mouth daily.      Prenatal Vit-Fe Fumarate-FA (PRENATAL MULTIVITAMIN) TABS tablet Take 1 tablet by mouth daily at 12 noon. 30 tablet 11     Review of Systems  Respiratory:  Positive for cough. Negative for shortness of breath.   Gastrointestinal:  Negative for abdominal pain, constipation, diarrhea, nausea and vomiting.  Genitourinary:  Negative for difficulty urinating, dysuria, vaginal bleeding and vaginal discharge.  Physical Exam   Blood pressure 113/66, pulse 77, temperature 98.4 F (36.9 C), temperature source Oral, resp. rate 20, height 5\' 5"  (1.651 m), weight 100.6 kg, last menstrual period 08/19/2020, unknown if currently breastfeeding.  Physical Exam Exam conducted with a chaperone present.  Constitutional:      Appearance: Normal appearance.  HENT:     Head: Normocephalic and atraumatic.  Eyes:     Conjunctiva/sclera: Conjunctivae normal.  Cardiovascular:     Rate and Rhythm: Normal rate and regular rhythm.     Heart sounds: Normal heart sounds.  Pulmonary:     Effort: Pulmonary effort  is normal.     Breath sounds: Normal breath sounds.  Abdominal:     General: Bowel sounds are normal.     Tenderness: There is no abdominal tenderness.  Genitourinary:    Comments: Sterile Speculum Exam: -Normal External Genitalia: Non tender, no apparent discharge at introitus.  -Vaginal Vault: Pink mucosa with good rugae. Small amt yellow mucoid discharge.  No pooling.  Fern [employer].  -Cervix:Pink, no lesions, cysts, or polyps.  Appears closed. No active bleeding or leaking from os -Bimanual Exam:  Deferred Musculoskeletal:     Cervical back: Normal range of motion.  Skin:    General: Skin is warm and dry.  Neurological:     Mental Status: She is alert and oriented to person, place, and time.  Psychiatric:        Mood and Affect: Mood normal.        Behavior: Behavior normal.        Thought Content: Thought content normal.    MAU  Course  Procedures Results for orders placed or performed during the hospital encounter of 01/02/21 (from the past 24 hour(s))  Amnisure rupture of membrane (rom)not at [facility]     Status: None   Collection Time: 01/02/21 11:59 PM  Result Value Ref Range   Amnisure ROM NEGATIVE        MDM Pelvic Exam Amnisure/Fern Ultrasound Assessment and Plan  25 year old [id number] TIUP at 17.3 weeks ?ROM Language Barrier  -POC Reviewed -Exam performed and findings discussed. -Fern collected and negative. -Amnisure collected and pending. -Will send for limited ob US to assess fluid levels. -Will await results. -Interpretations completed with assistance of video interpreter: Jasmine December [phone].   Cherre Robins 01/02/2021, 11:37 PM   Reassessment (1:26 AM) No Leaking of Amniotic Fluid  -Amnisure returns negative. -[place] with AFI in normal limits. -Provider to bedside to discuss results. -Patient instructed to monitor for continued leaking. -Given list of pregnancy safe medications for treatment of cough. -Encouraged to call or return to MAU if symptoms worsen or with the onset of new symptoms. -Discharged to home in stable condition. -Interpretations completed with assistance of video interpreter: Thomes Lolling [phone].   Cherre Robins MSN, CNM Advanced Practice Provider, [facility] [employer]

## 2021-01-03 ENCOUNTER — Inpatient Hospital Stay (HOSPITAL_BASED_OUTPATIENT_CLINIC_OR_DEPARTMENT_OTHER): Payer: Self-pay

## 2021-01-03 DIAGNOSIS — O321XX2 Maternal care for breech presentation, fetus 2: Secondary | ICD-10-CM

## 2021-01-03 DIAGNOSIS — Z3A17 17 weeks gestation of pregnancy: Secondary | ICD-10-CM

## 2021-01-03 DIAGNOSIS — O4692 Antepartum hemorrhage, unspecified, second trimester: Secondary | ICD-10-CM

## 2021-01-03 DIAGNOSIS — O321XX1 Maternal care for breech presentation, fetus 1: Secondary | ICD-10-CM

## 2021-01-03 DIAGNOSIS — O30042 Twin pregnancy, dichorionic/diamniotic, second trimester: Secondary | ICD-10-CM

## 2021-01-03 LAB — MATERNIT21 PLUS CORE+SCA

## 2021-01-03 LAB — URINALYSIS, ROUTINE W REFLEX MICROSCOPIC
Bilirubin Urine: NEGATIVE
Glucose, UA: NEGATIVE mg/dL
Hgb urine dipstick: NEGATIVE
Ketones, ur: NEGATIVE mg/dL
Leukocytes,Ua: NEGATIVE
Nitrite: NEGATIVE
Protein, ur: NEGATIVE mg/dL
Specific Gravity, Urine: 1.019 (ref 1.005–1.030)
pH: 6 (ref 5.0–8.0)

## 2021-01-03 LAB — AMNISURE RUPTURE OF MEMBRANE (ROM) NOT AT ARMC: Amnisure ROM: NEGATIVE

## 2021-01-07 LAB — MATERNIT 21 PLUS CORE, BLOOD
Fetal Fraction: 17
Result (T21): NEGATIVE
Trisomy 13 (Patau syndrome): NEGATIVE
Trisomy 18 (Edwards syndrome): NEGATIVE
Trisomy 21 (Down syndrome): NEGATIVE

## 2021-01-07 LAB — SPECIMEN STATUS REPORT

## 2021-01-08 ENCOUNTER — Telehealth: Payer: Self-pay | Admitting: Obstetrics and Gynecology

## 2021-01-08 NOTE — Telephone Encounter (Signed)
With the assistance of a Spanish interpreter, Moishe Spice, genetic counseling intern, under supervision of Katrina Stack, MS, CGC, called [name] to disclose her MaterniT21 genetic screening results.   This test came back low-risk, or normal, for Down syndrome, Trisomy 18, and Trisomy 13 with 99% accuracy. The patient expressed that she was reassured by this result. We also shared the sex of the babies are consistent with two female fetuses. If additional information on chromosome conditions is desired, amniocentesis is available.  If screening for neural tube defects is desired, we would recommend maternal serum AFP testing through her OB.  If the patient has any additional questions or concerns, she can reach [place] at [phone].   Cherly Anderson, MS, CGC

## 2021-01-09 ENCOUNTER — Ambulatory Visit (INDEPENDENT_AMBULATORY_CARE_PROVIDER_SITE_OTHER): Payer: Self-pay | Admitting: Obstetrics & Gynecology

## 2021-01-09 ENCOUNTER — Other Ambulatory Visit: Payer: Self-pay

## 2021-01-09 VITALS — BP 111/76 | HR 108 | Wt 220.6 lb

## 2021-01-09 DIAGNOSIS — O0990 Supervision of high risk pregnancy, unspecified, unspecified trimester: Secondary | ICD-10-CM | POA: Insufficient documentation

## 2021-01-09 DIAGNOSIS — O30049 Twin pregnancy, dichorionic/diamniotic, unspecified trimester: Secondary | ICD-10-CM

## 2021-01-09 DIAGNOSIS — Z3A18 18 weeks gestation of pregnancy: Secondary | ICD-10-CM

## 2021-01-09 NOTE — Progress Notes (Signed)
   PRENATAL VISIT NOTE  Subjective:  Amy Prince is a 25 y.o. [id number] at [redacted]w[redacted]d being seen today for ongoing prenatal care. Patient is Spanish-speaking only, interpreter present for this encounter. She is currently monitored for the following issues for this high-risk pregnancy and has Language barrier; Obesity in pregnancy; History of pre-eclampsia in prior pregnancy, currently pregnant; Twin pregnancy, antepartum; Antepartum bleeding, second trimester; and Supervision of high risk pregnancy, antepartum on their problem list.  Patient reports no complaints.  Contractions: Not present. Vag. Bleeding: None.  Movement: Present. Denies leaking of fluid.   The following portions of the patient's history were reviewed and updated as appropriate: allergies, current medications, past family history, past medical history, past social history, past surgical history and problem list.   Objective:   Vitals:   01/09/21 1043  BP: 111/76  Pulse: (!) 108  Weight: 220 lb 9.6 oz (100.1 kg)    Fetal Status: Fetal Heart Rate (bpm): 154/156   Movement: Present     General:  Alert, oriented and cooperative. Patient is in no acute distress.  Skin: Skin is warm and dry. No rash noted.   Cardiovascular: Normal heart rate noted  Respiratory: Normal respiratory effort, no problems with respiration noted  Abdomen: Soft, gravid, appropriate for gestational age.  Pain/Pressure: Absent     Pelvic: Cervical exam deferred        Extremities: Normal range of motion.  Edema: None  Mental Status: Normal mood and affect. Normal behavior. Normal judgment and thought content.   Assessment and Plan:  Pregnancy: [id number] at [redacted]w[redacted]d 1. Dichorionic diamniotic twin pregnancy, antepartum 2. [redacted] weeks gestation of pregnancy 3. Supervision of high risk pregnancy, antepartum Low risk NIPS. Anatomy scan already scheduled. - AFP, Serum, Open Spina Bifida No other complaints or concerns.  Routine obstetric precautions  reviewed.  Please refer to After Visit Summary for other counseling recommendations.   Return in about 4 weeks (around 02/06/2021) for OFFICE OB VISIT (MD only).  Future Appointments  Date Time Provider Department Center  02/06/2021  9:15 AM Venora Maples, MD [facility]  02/06/2021  1:15 PM [facility] NURSE [facility]  02/06/2021  1:30 PM [facility] US3 [facility]    Jaynie Collins, MD

## 2021-01-09 NOTE — Patient Instructions (Addendum)
MyChart technical support line at [phone]  Segundo trimestre de [place] Second Trimester of Pregnancy  El segundo trimestre de Psychiatrist va desde la semana 13 hasta la semana 27. Es Designer, jewellery desde el mes 4 hasta el mes 6 de [place]. El segundo trimestre suele ser el momento en el que mejor se siente. Su organismo se ha adaptado a Hydrologist, y comienza a Diplomatic Services operational officer. Durante el segundo trimestre: Las nuseas del embarazo han disminuido o han desaparecido completamente. Usted puede tener ms energa. Es posible que tenga un aumento del apetito. El segundo trimestre es tambin un perodo en el que el beb en gestacin (feto) crece rpidamente. Hacia el final del sexto mes, el feto puede medir aproximadamente 12 pulgadas y pesar alrededor de 1 libras. Es probable que sienta que el beb se mueve (da pataditas) entre las 16 y 20 semanas del Psychiatrist. Cambios en el cuerpo durante el segundo trimestre Su cuerpo continua experimentando numerosos cambios durante su segundo trimestre. Los cambios varan y generalmente vuelven a la normalidad despusdel nacimiento del beb. Cambios fsicos Seguir [employer] Companies. Notar que la parte baja del abdomen sobresale. Podrn aparecer las primeras Albertson's caderas, el abdomen y las [place]. Las [employer]ra Foods seguirn creciendo y se tornarn sensibles. Pueden aparecer zonas oscuras o manchas (cloasma o mscara del embarazo) en el rostro. Es posible que se forme una lnea oscura desde el ombligo hasta la zona del pubis (linea nigra). Tal vez haya cambios en el cabello. Esto cambios pueden incluir su engrosamiento, crecimiento rpido y [employer] textura. A algunas personas tambin se les cae el cabello durante o despus del [place], o tienen el cabello seco o fino. Cambios en la salud Comienza a tener dolores de [place]. Es posible que tenga acidez estomacal. Puede tener estreimiento. Pueden aparecer hemorroides o abultarse e  hincharse las venas (venas varicosas). Las Veterinary surgeon y estar sensibles al cepillado y al hilo dental. Nicanor Bake vez tenga necesidad de orinar con ms frecuencia porque el feto est ejerciendo presin sobre la vejiga. Puede sentir dolor en la espalda. Esto se debe a: Aumento de peso. Las hormonas del Management consultant las articulaciones en la pelvis. Un cambio en el peso y los msculos que ayudan a Pharmacologist su equilibrio. Siga estas instrucciones en su casa: Medicamentos Siga las instrucciones del mdico en relacin con el uso de medicamentos. Durante el embarazo, hay medicamentos que pueden tomarse y otros que no. No tome medicamentos a menos que lo haya autorizado el mdico. Tome vitaminas prenatales que contengan por lo menos 600 microgramos (mcg) de cido flico. Comida y bebida Lleve una dieta saludable que incluya frutas y verduras frescas, cereales integrales, buenas fuentes de protenas como carnes De Beque, huevos o tofu, y productos lcteos descremados. Evite la carne cruda y el [place], la [place] y el queso sin Market researcher. Estos portan grmenes que pueden provocar dao tanto a usted como al beb. Es posible que tenga que tomar estas medidas para prevenir o tratar el estreimiento: Product manager suficiente lquido como para Pharmacologist la orina de color amarillo plido. Consumir alimentos ricos en fibra, como frijoles, cereales integrales, y frutas y verduras frescas. Limitar el consumo de alimentos ricos en grasa y azcares procesados, como los alimentos fritos o dulces. Actividad Haga ejercicio solamente como se lo haya indicado el mdico. La mayora de las personas pueden continuar su rutina de ejercicios durante el Soldier. Intente realizar como mnimo 30 minutos de actividad fsica por lo menos 5 das a la [place].  Deje de hacer ejercicio si comienza a Teacher, music. Deje de hacer ejercicio si le aparecen dolor o clicos en la parte baja del vientre o de la espalda. Evite  hacer ejercicio si hace mucho calor o humedad, o si se encuentra a una altitud elevada. Evite levantar pesos [employer]. Si lo desea, puede seguir teniendo [employer] [place] [place] [employer], salvo que el mdico le indique lo contrario. Alivio del dolor y del Dentist Use un sujetador que le brinde buen soporte para prevenir las molestias causadas por la sensibilidad en las [place]. Dese baos de asiento con agua tibia para Engineer, materials o las molestias causadas por las hemorroides. Use una crema para las hemorroides si el mdico la autoriza. Descanse con las piernas levantadas (elevadas) si tiene calambres en las piernas o dolor en la parte baja de la espalda. Si tiene venas varicosas: Use medias de compresin como se lo haya indicado el mdico. Eleve los pies durante 15 minutos, 3 o 4 veces por da. Limite el consumo de sal en su dieta. Seguridad Use el cinturn de seguridad en todo momento mientras conduce o va en auto. Hable con el mdico si es vctima de [employer] o fsico. Estilo de vida No se d baos de inmersin en agua caliente, baos turcos ni saunas. No se haga duchas vaginales. No use tampones ni toallas higinicas perfumadas. Evite el contacto con las bandejas sanitarias de los gatos y la tierra que estos animales usan. Estos elementos contienen bacterias que pueden causar defectos congnitos al beb y la posible prdida del feto debido a un aborto espontneo o muerte fetal. No use remedios a base de hierbas, alcohol, drogas ilegales ni medicamentos que no estn aprobados por el mdico. Las sustancias qumicas de estos productos pueden daar al beb. No consuma ningn producto que contenga nicotina o tabaco, como cigarrillos, cigarrillos electrnicos y tabaco de Theatre manager. Si necesita ayuda para dejar de fumar, consulte al mdico. Instrucciones generales Durante una visita prenatal de rutina, el [employer] har un examen fsico y Probation officer. Tambin le hablar sobre su salud general. Cumpla  con todas las visitas de seguimiento. Esto es importante. Pdale al mdico que la derive a clases de educacin prenatal en su localidad. Pida ayuda si tiene necesidades nutricionales o de asesoramiento Academic librarian. El mdico puede aconsejarla o derivarla a especialistas para que la ayuden con diferentes necesidades. Dnde buscar ms informacin American Pregnancy Association (Asociacin Americana del Embarazo): americanpregnancy.org [employer] Corporation of Obstetricians and Gynecologists (Colegio Estadounidense de Obstetras y Centerview): EmploymentAssurance.cz? Office on [employer] (Oficina para la Salud de la Mujer): MightyReward.co.nz Comunquese con un mdico si tiene: Un dolor de cabeza que no desaparece despus de Science writer. Cambios en la visin o ve manchas delante de los ojos. Clicos leves, presin en la pelvis o dolor persistente en el abdomen. Nuseas persistentes, vmitos o diarrea. Secrecin vaginal con mal olor u orina con [employer] ftido. Dolor al Beatrix Shipper. Hinchazn sbita o extrema del rostro, las [address], los tobillos, los pies o las piernas. Grant Ruts. Busque ayuda de inmediato si: Tiene una prdida de lquido por la vagina. Tiene sangrado ligero o manchas vaginales. Tiene dolor intenso o clicos en el abdomen. Presenta dificultad para respirar. Siente dolor en el pecho. Tiene episodios de [employer]. No ha sentido a su beb moverse durante el perodo de [employer] indic el mdico. Tiene dolor, hinchazn o enrojecimiento nuevos o ms intensos en un brazo o una pierna. Resumen El segundo trimestre de embarazo va  desde la semana 13 hasta la 27 (desde el mes 4 hasta el 6). No use remedios a base de hierbas, alcohol, drogas ilegales ni medicamentos que no estn aprobados por el mdico. Las sustancias qumicas de estos productos pueden daar al beb. Haga ejercicio solamente como se lo haya indicado el mdico. La mayora de las personas pueden  continuar su rutina de ejercicios durante el [place]. Cumpla con todas las visitas de seguimiento. Esto es importante. Esta informacin no tiene Theme park manager el consejo del mdico. Asegresede hacerle al mdico cualquier pregunta que tenga. Document Revised: 12/17/2019 Document Reviewed: 12/17/2019 Elsevier Patient Education  2022 [employer].

## 2021-01-11 LAB — AFP, SERUM, OPEN SPINA BIFIDA
AFP MoM: 8.49
AFP Value: 323.4 ng/mL
Gest. Age on Collection Date: 18.4 weeks
Maternal Age At EDD: 27.1 yr
OSBR Risk 1 IN: 18
Test Results:: POSITIVE — AB
Weight: 220 [lb_av]

## 2021-01-12 ENCOUNTER — Encounter: Payer: Self-pay | Admitting: Obstetrics & Gynecology

## 2021-01-12 DIAGNOSIS — O280 Abnormal hematological finding on antenatal screening of mother: Secondary | ICD-10-CM | POA: Insufficient documentation

## 2021-01-13 ENCOUNTER — Telehealth: Payer: Self-pay | Admitting: General Practice

## 2021-01-13 NOTE — Telephone Encounter (Signed)
Called patient with Amy Prince assisting with Spanish interpretation. Explained results to patient and provided reassurance. Future ultrasound appt info reviewed as well. Patient verbalized understanding.

## 2021-01-13 NOTE — Telephone Encounter (Signed)
-----   Message from Tereso Newcomer, MD sent at 01/11/2021 12:58 PM EDT ----- Positive AFP but had no concerning symptoms of her anatomy scan for both twins. Also had LR NIPS x 2.  Follow up MFM scan already scheduled.  Please tell patient results, but reassure that no neural defects found on scan. Sometimes AFP can be a marker for other conditions such as increased risk for preeclampsia (she has a history of this).

## 2021-01-29 ENCOUNTER — Ambulatory Visit: Payer: Self-pay

## 2021-02-06 ENCOUNTER — Ambulatory Visit: Payer: Self-pay

## 2021-02-06 ENCOUNTER — Ambulatory Visit (HOSPITAL_BASED_OUTPATIENT_CLINIC_OR_DEPARTMENT_OTHER): Payer: Self-pay

## 2021-02-06 ENCOUNTER — Other Ambulatory Visit: Payer: Self-pay

## 2021-02-06 ENCOUNTER — Encounter: Payer: Self-pay | Admitting: *Deleted

## 2021-02-06 ENCOUNTER — Ambulatory Visit: Payer: Self-pay | Attending: Obstetrics | Admitting: Obstetrics

## 2021-02-06 ENCOUNTER — Telehealth (INDEPENDENT_AMBULATORY_CARE_PROVIDER_SITE_OTHER): Payer: Self-pay | Admitting: Family Medicine

## 2021-02-06 ENCOUNTER — Encounter: Payer: Self-pay | Admitting: Family Medicine

## 2021-02-06 ENCOUNTER — Other Ambulatory Visit: Payer: Self-pay | Admitting: Maternal & Fetal Medicine

## 2021-02-06 ENCOUNTER — Ambulatory Visit: Payer: Self-pay | Attending: Maternal & Fetal Medicine | Admitting: *Deleted

## 2021-02-06 VITALS — BP 103/59 | HR 85

## 2021-02-06 DIAGNOSIS — E669 Obesity, unspecified: Secondary | ICD-10-CM

## 2021-02-06 DIAGNOSIS — Z3A22 22 weeks gestation of pregnancy: Secondary | ICD-10-CM

## 2021-02-06 DIAGNOSIS — O30049 Twin pregnancy, dichorionic/diamniotic, unspecified trimester: Secondary | ICD-10-CM

## 2021-02-06 DIAGNOSIS — O09299 Supervision of pregnancy with other poor reproductive or obstetric history, unspecified trimester: Secondary | ICD-10-CM

## 2021-02-06 DIAGNOSIS — O30042 Twin pregnancy, dichorionic/diamniotic, second trimester: Secondary | ICD-10-CM

## 2021-02-06 DIAGNOSIS — Z363 Encounter for antenatal screening for malformations: Secondary | ICD-10-CM

## 2021-02-06 DIAGNOSIS — O99212 Obesity complicating pregnancy, second trimester: Secondary | ICD-10-CM | POA: Insufficient documentation

## 2021-02-06 DIAGNOSIS — O99210 Obesity complicating pregnancy, unspecified trimester: Secondary | ICD-10-CM

## 2021-02-06 DIAGNOSIS — O09292 Supervision of pregnancy with other poor reproductive or obstetric history, second trimester: Secondary | ICD-10-CM

## 2021-02-06 DIAGNOSIS — Z789 Other specified health status: Secondary | ICD-10-CM

## 2021-02-06 DIAGNOSIS — O281 Abnormal biochemical finding on antenatal screening of mother: Secondary | ICD-10-CM

## 2021-02-06 DIAGNOSIS — O280 Abnormal hematological finding on antenatal screening of mother: Secondary | ICD-10-CM

## 2021-02-06 DIAGNOSIS — O4692 Antepartum hemorrhage, unspecified, second trimester: Secondary | ICD-10-CM

## 2021-02-06 DIAGNOSIS — Z8759 Personal history of other complications of pregnancy, childbirth and the puerperium: Secondary | ICD-10-CM

## 2021-02-06 DIAGNOSIS — O0990 Supervision of high risk pregnancy, unspecified, unspecified trimester: Secondary | ICD-10-CM

## 2021-02-06 DIAGNOSIS — O09212 Supervision of pregnancy with history of pre-term labor, second trimester: Secondary | ICD-10-CM | POA: Insufficient documentation

## 2021-02-06 DIAGNOSIS — O322XX1 Maternal care for transverse and oblique lie, fetus 1: Secondary | ICD-10-CM

## 2021-02-06 DIAGNOSIS — O337XX0 Maternal care for disproportion due to other fetal deformities, not applicable or unspecified: Secondary | ICD-10-CM

## 2021-02-06 NOTE — Progress Notes (Signed)
I connected with Amy Prince 02/06/21 at  9:15 AM EDT by: MyChart video and verified that I am speaking with the correct person using two identifiers.  Patient is located at home and provider is located at home.     The purpose of this virtual visit is to provide medical care while limiting exposure to the novel coronavirus. I discussed the limitations, risks, security and privacy concerns of performing an evaluation and management service by MyChart video and the availability of in person appointments. I also discussed with the patient that there may be a patient responsible charge related to this service. By engaging in this virtual visit, you consent to the provision of healthcare.  Additionally, you authorize for your insurance to be billed for the services provided during this visit.  The patient expressed understanding and agreed to proceed.  The following staff members participated in the virtual visit:  Venora Maples, MD/MPH Attending Family Medicine Physician, [facility] for [facility], [facility]     PRENATAL VISIT NOTE  Subjective:  Amy Prince is a 25 y.o. [id number] at [redacted]w[redacted]d  for phone visit for ongoing prenatal care.  She is currently monitored for the following issues for this high-risk pregnancy and has Language barrier; Obesity in pregnancy; History of pre-eclampsia in prior pregnancy, currently pregnant; Twin pregnancy, antepartum; Antepartum bleeding, second trimester; Supervision of high risk pregnancy, antepartum; and Increased antenatal AFP screen on their problem list.  Patient reports  pelvic pressure .  Contractions: Not present. Vag. Bleeding: None.  Movement: Present. Denies leaking of fluid.   The following portions of the patient's history were reviewed and updated as appropriate: allergies, current medications, past family history, past medical history, past social history, past surgical history and problem  list.   Objective:  There were no vitals filed for this visit. Self-Obtained  Fetal Status:     Movement: Present     Assessment and Plan:  Pregnancy: [id number] at [redacted]w[redacted]d 1. Supervision of high risk pregnancy, antepartum Good fetal movement Home BP cuff out of battery Review of records from [facility] shows she does not have complete labwork up results yet, though they were done Will have staff reach out to [facility] for remainder of labs Discussed GDM testing for next visit, need to come fasting  2. Dichorionic diamniotic twin pregnancy, antepartum Following w MFM Normal [place] 7/16 though borderline EFW of 11% in twin B, EDD also changed based on that scan Follow up scheduled for later today  3. History of pre-eclampsia in prior pregnancy, currently pregnant On ASA  4. Obesity in pregnancy   5. Language barrier Spanish  6. Increased antenatal AFP screen Normal [place], to discuss further with MFM later today  Preterm labor symptoms and general obstetric precautions including but not limited to vaginal bleeding, contractions, leaking of fluid and fetal movement were reviewed in detail with the patient.  Return in 4 weeks (on 03/06/2021) for [facility], ob visit.  Future Appointments  Date Time Provider Department Center  02/06/2021  1:15 PM [facility] NURSE [facility]  02/06/2021  1:30 PM [facility] US3 [facility]     Time spent on virtual visit: 10 minutes  Venora Maples, MD

## 2021-02-06 NOTE — Progress Notes (Signed)
Pt states pain in groin area and in legs, is that normal?

## 2021-02-06 NOTE — Patient Instructions (Signed)
[employer] ([place]).Twin and Triplet Pregnancy. [place]: [employer] for [employer] and [employer] ([place]); 2019.">  Vanetta Mulders mltiple Multiple Pregnancy Cuando una mujer tiene un embarazo mltiple, espera ms de un beb al [employer]. Puede estar embarazada de gemelos, trillizos o ms. En la [employer], los embarazos mltiples son de [place]. Es muy poco frecuente concebir de forma natural trillizos o ms cantidad de bebs. Los embarazos mltiples son ms riesgosos que los [employer] de un solo beb. Una mujer con un embarazo mltiple es ms propensa a Contractor. Cmo ocurre un embarazo mltiple? Un embarazo mltiple ocurre cuando: El cuerpo de la mujer libera ms de un vulo al mismo tiempo y luego cada vulo es fertilizado por un espermatozoide diferente. Este es el tipo de embarazo mltiple ms frecuente. Los gemelos o ms bebs producidos de [employer] se denominan fraternos. No son ms parecidos que los hermanos que nacieron de embarazos individuales. Un espermatozoide fertiliza un vulo, que luego se divide en ms de un embrin. Los gemelos o ms bebs producidos de esta manera se denominan idnticos. En este tipo de [employer], los bebs son siempre del mismo sexo y muy parecidos. Quines tienen ms probabilidades de tener un embarazo mltiple? Existen ms probabilidades de Research officer, political party en mujeres que: Realizaron un tratamiento de fertilidad, especialmente si el tratamiento incluy frmacos de fertilidad. Son [place] de 33aos de edad. Ya tuvieron cuatro hijos o ms. Tienen antecedentes familiares de embarazos mltiples. Cmo se diagnostica un embarazo mltiple? Un embarazo mltiple se puede diagnosticar en funcin de: Sntomas como los siguientes: Aumento rpido de peso en los primeros de Psychiatrist (primer trimestre). Nuseas ms intensas y ms dolor en las mamas a la palpacin de lo que es  tpico en un embarazo de un beb. tero ms grande de lo normal para la etapa del Psychiatrist. Anlisis de [employer] un nivel de gonadotropina corinica humana [facility]) superior al normal. Esta es una hormona producida por el cuerpo al comienzo del Psychiatrist. Una ecografa. Este estudio se [place] para confirmar un Medical illustrator. Qu riesgos trae aparejados el embarazo mltiple? Un embarazo mltiple la pone en un riesgo mayor de ciertos problemas durante o despus del [place]. Esto incluye lo siguiente: Parto de los bebs antes de la fecha de parto (parto prematuro). Un embarazo a trmino comprende un mnimo de 37semanas. Los bebs nacidos antes de las 37semanas pueden tener un riesgo mayor de problemas respiratorios, dificultades en la alimentacin, parlisis cerebral y dificultades de aprendizaje. Diabetes. Preeclampsia. Esta es una afeccin grave que provoca presin arterial alta y dolores de cabeza durante Firefighter. Abundante prdida de sangre despus del parto (hemorragia posparto). Depresin posparto. Bebs con bajo peso al nacer. Cmo afectar a mi atencin mdica un embarazo mltiple? El equipo mdico la controlar ms atentamente. Es posible que deba concurrir a visitas prenatales ms frecuentes. [employer], se asegurarn de que ustedest sana y que los bebs crezcan normalmente. Siga estas instrucciones en su casa: Comida y bebida [place] la nutricin. Siga las recomendaciones de su mdico para aumentar de [place]. Es posible que deba aumentar algo de peso extra si el embarazo es mltiple. Coma refrigerios saludables varias veces durante el da. De esta forma, sumar caloras y reducir las nuseas. Beba suficiente lquido como para Pharmacologist la orina de color amarillo plido. Tomar las vitaminas prenatales. Pregntele al mdico qu vitaminas son adecuadas para usted. Actividad Limite las actividades despus de 20 a 24 semanas  de embarazo. Descanse con frecuencia. Evite  las [place], [place] ejercicio y el trabajo que exijan mucho esfuerzo. Consulte al mdico cundo debe dejar de Management consultant. Instrucciones generales No consuma ningn producto que contenga nicotina o tabaco, como cigarrillos, cigarrillos electrnicos y tabaco de Theatre manager. Si necesita ayuda para dejar de fumar, consulte al mdico. No beba alcohol ni consuma drogas ilegales. Use los medicamentos de venta libre y los recetados solamente como se lo haya indicado el mdico. Maricela Curet arreglos para recibir ayuda adicional con las tareas de la casa. Concurra a todas las visitas de seguimiento y a todas las visitas prenatales como se lo haya indicado el mdico. Esto es importante. Dnde buscar ms informacin Dietitian and Gynecology (Colegio Estadounidense de Obstetras y Gineclogos): www.acog.org Comunquese con un mdico si: Tiene mareos. Tiene nuseas, vmitos o diarrea que no se calman. Tiene depresin u otras emociones que interfieren con sus actividades normales. Tiene fiebre. Siente dolor al [employer]. Brett Fairy secrecin vaginal con mal olor. Observa ms hinchazn en la cara, las manos, las piernas o los tobillos. Solicite ayuda de inmediato si: Tiene una prdida de lquido por la vagina. Tiene una hemorragia vaginal. Siente calambres en la pelvis, presin en la pelvis o dolor persistente en el abdomen o en la parte baja de la espalda. Tiene contracciones regulares. Tiene dolor de cabeza intenso, con o sin cambios en la visin. Siente falta de aire o Journalist, newspaper. Nota que los bebs se mueven con menos frecuencia o no se mueven en absoluto. Resumen Se habla de embarazo mltiple cuando una mujer espera ms de un beb al [employer]. Un embarazo mltiple la pone en un riesgo ms alto de dar a luz a los bebs antes de la fecha de [place], tener diabetes, preeclampsia, abundante prdida de sangre despus del parto o de que los bebs tengan un bajo peso al  nacer. El mdico la controlar ms atentamente durante su Psychiatrist. Tambin es posible que deba hacer algunos cambios en su estilo de vida durante el [place]. Esto incluye comer ms, limitar sus actividades despus de 20 a 24 semanas de embarazo y Dispensing optician con [place] extra en las tareas de la casa. Concurra a las consultas de control con el mdico segn las instrucciones o si presenta alguna complicacin. Esta informacin no tiene Theme park manager el consejo del mdico. Asegresede hacerle al mdico cualquier pregunta que tenga. Document Revised: 04/03/2019 Document Reviewed: 04/03/2019 Elsevier Patient Education  2022 Elsevier Inc.  [employer] del mtodo anticonceptivo Contraception Choices La anticoncepcin, o los mtodos anticonceptivos, hace referencia a los mtodoso dispositivos que evitan el [place]. Mtodos hormonales  Implante anticonceptivo Un implante anticonceptivo consiste en un tubo delgado de plstico que contiene una hormona que evita el [place]. Es diferente de un dispositivo intrauterino (DIU). Un mdico lo inserta en la parte superior del brazo. Los implantespueden ser eficaces durante un mximo de 3 aos. Inyecciones de progestina sola Las inyecciones de progestina sola contienen progestina, una forma sinttica dela hormona progesterona. Un mdico las administra cada 3 meses. Pldoras anticonceptivas Las pldoras anticonceptivas son pastillas que contienen hormonas que evitan el [place]. Deben tomarse una vez al da, preferentemente a la misma hora cadada. Se necesita una receta para utilizar este mtodo anticonceptivo. Parche anticonceptivo El parche anticonceptivo contiene hormonas que evitan el [place]. Se coloca en la piel, debe cambiarse una vez a la semana durante tres semanas y debe retirarse en la cuarta semana. Se necesita una receta para Scientific laboratory technician.  Anillo vaginal Un anillo vaginal contiene hormonas que evitan el embarazo. Se coloca en  la vagina durante tres semanas y se retira en la cuarta semana. Luego se repite el proceso con un anillo nuevo. Se necesita una receta para Scientific laboratory technician. Anticonceptivo de emergencia Los anticonceptivos de emergencia son mtodos para evitar un embarazo despus de Warehouse manager sexo sin proteccin. Vienen en forma de pldora y pueden tomarse hasta 5 das despus de [place]. Funcionan mejor cuando se toman lo ms pronto posible luego de [employer]. La mayora de los anticonceptivos de emergencia estn disponibles sin receta mdica. Este mtodo no debe utilizarse como elnico mtodo anticonceptivo. Mtodos de barrera  Condn masculino Un condn masculino es una vaina delgada que se coloca sobre el pene durante el sexo. Los condones evitan que el esperma ingrese en el cuerpo de la [place]. Pueden utilizarse con un una sustancia que mata a los espermatozoides (espermicida) para aumentar la efectividad. Deben desecharse despus de un uso. Condn femenino Un condn femenino es una vaina blanda y holgada que se coloca en la vagina antes de [place]. El condn evita que el esperma ingrese en el cuerpo de Insurance claims handler. Deben desecharse despus de un uso. Diafragma Un diafragma es una barrera blanda con forma de cpula. Se inserta en la vagina antes del sexo, junto con un espermicida. El diafragma bloquea el ingreso de esperma en el tero, y el espermicida mata a los espermatozoides. El Designer, fashion/clothing en la vagina durante 6 a 8 horas despus de Warehouse manager sexo y deberetirarse en el plazo de las 24 horas. Un diafragma es recetado y colocado por un mdico. Debe reemplazarse cada 1 a 2 aos, despus de dar a luz, de aumentar ms de 15lb (6.8kg) y de [place]. Capuchn cervical Un capuchn cervical es una copa redonda y blanda de ltex o plstico que se coloca en el cuello uterino. Se inserta en la vagina antes del sexo, junto con un espermicida. Bloquea el ingreso del esperma en el  tero. El capuchn Radio producer durante 6 a 8 horas despus de Warehouse manager sexo y debe retirarse en el plazo de las 48 horas. Un capuchn cervical debe ser recetado ycolocado por un mdico. Debe reemplazarse cada 2aos. Esponja Una esponja es una pieza blanda y circular de espuma de poliuretano que contiene espermicida. La esponja ayuda a bloquear el ingreso de esperma en el tero, y el espermicida mata a los espermatozoides. Belva Bertin, debe humedecerla e insertarla en la vagina. Debe insertarse antes de [employer], debe permanecer dentro al menos durante 6 horas despus de Warehouse manager sexo y deberetirarse y Nurse, adult en el plazo de las 30 horas. Espermicidas Los espermicidas son sustancias qumicas que matan o bloquean al esperma y no lo dejan ingresar al cuello uterino y al tero. Vienen en forma de crema, gel, supositorio, espuma o comprimido. Un espermicida debe insertarse en la vagina con un aplicador al menos 10 o 15 minutos antes de tener sexo para dar tiempo a que surta [place]. El proceso debe repetirse cada vez que tenga sexo. Losespermicidas no requieren receta mdica. Anticonceptivos intrauterinos Dispositivo intrauterino (DIU) Un DIU es un dispositivo en forma de T que se coloca en el tero. Existen dos tipos: DIU hormonal.Este tipo contiene progestina, una forma sinttica de la hormona progesterona. Este tipo puede permanecer colocado durante 3 a 5 aos. DIU de cobre.Este tipo est recubierto con un alambre de cobre. Puede permanecer colocado durante 10 aos. Mtodos anticonceptivos permanentes [place]  de trompas en la mujer En este mtodo, se sellan, atan u obstruyen las trompas de Falopio durante [place]esunaciruga para Automotive engineerevitar que el vulo descienda hacia el tero. Esterilizacin histeroscpica En este mtodo, se coloca un implante pequeo y flexible dentro de cada trompa de Falopio. Los implantes hacen que se forme un tejido cicatricial en las trompas de Falopio y que las obstruya  para que el espermatozoide no pueda llegar al vulo. El procedimiento demora alrededor de 3 meses para que seaefectivo. Debe utilizarse otro mtodo anticonceptivo durante esos 3 meses. Esterilizacin masculina Este es un procedimiento que consiste en atar los conductos que transportan el esperma (vasectoma). Luego del procedimiento, el hombre Manufacturing engineerpuede eyacular lquido (semen). Debe utilizarse otro mtodo anticonceptivo durante 3 meses despus delprocedimiento. Mtodos de planificacin natural Planificacin familiar natural En este mtodo, la pareja no tiene American Family Insurancesexo durante los das en que la mujer podra quedar Valenciaembarazada. Mtodo calendario En este mtodo, la mujer realiza un seguimiento de la duracin de cada ciclo menstrual, identifica los Becton, Dickinson and Companydas en los que se puede producir un Psychiatristembarazo y no tiene sexo durante esos [address]  [address]das. Mtodo de la ovulacin En este mtodo, la pareja evita tener sexo durante la ovulacin. Mtodo sintotrmico Este mtodo implica no tener sexo durante la ovulacin. Normalmente, la mujer comprueba la ovulacinal observar cambios en su temperatura y en la consistencia del moco cervical. Mtodo posovulacin En este mtodo, la pareja espera a que finalice la ovulacin para Doctor, hospitaltener sexo. Dnde buscar ms informacin Centers for Disease Control and Prevention (Centros para el Control y Psychiatristla Prevencin de Event organisernfermedades): FootballExhibition.com.brwww.cdc.gov Resumen La anticoncepcin, o los mtodos anticonceptivos, hace referencia a los mtodos o dispositivos que evitan el [place]woodembarazo. Los mtodos anticonceptivos hormonales incluyen implantes, inyecciones, pastillas, parches, anillos vaginales y anticonceptivos de Associate Professoremergencia. Los mtodos anticonceptivos de barrera pueden incluir condones masculinos, condones femeninos, diafragmas, capuchones cervicales, esponjas y espermicidas. Guardian Life InsuranceExisten dos tipos de DIU (dispositivo intrauterino). Un DIU puede colocarse en el tero de una mujer para evitar el embarazo durante 3 a 5 aos. La  esterilizacin permanente puede realizarse mediante un procedimiento tanto en los hombres como en las mujeres. Los The Krogermtodos de Medical sales representativeplanificacin familiar natural implican no tener American Family Insurancesexo durante los das en que la mujer podra quedar Jenningsembarazada. Esta informacin no tiene Theme park managercomo fin reemplazar el consejo del mdico. Asegresede hacerle al mdico cualquier pregunta que tenga. Document Revised: 01/08/2020 Document Reviewed: 01/08/2020 Elsevier Patient Education  2022 Arvin[employer]itorElsevier Inc.

## 2021-02-08 NOTE — Progress Notes (Addendum)
MFM Note  Amy Prince was seen for a detailed ultrasound due to a spontaneously conceived twin gestation.  Her pregnancy has been complicated by maternal obesity and an elevated MSAFP of 8.49 MoM.  She denies any significant past medical history.    She has a history of preeclampsia in her prior pregnancy.  Her current medications include a daily prenatal vitamin and baby aspirin.  She had a cell free DNA test (Materni T21) drawn earlier in her pregnancy that showed a low risk for trisomy 62, 14, and 66.  Female fetuses are predicted.  On today's exam, a thick dividing membrane was noted separating the two fetuses, indicating that these are most likely dichorionic, diamniotic twins.  There was a question regarding the chronicity of these twins as only a single placenta is noted.  Twin A: Fetal hydrops is noted with scalp edema, abdominal ascites, and skin edema of the thorax and abdomen.  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  The views of the fetal heart were unable to be visualized in twin A due to the fetal position.  The overall EFW measures at 1 pound 7 ounces which is greater than the 99th percentile primarily because of the skin edema and abdominal ascites which is making the measurements larger.  The peak systolic velocity of the middle cerebral arteries measured in twin A did not indicate fetal anemia.  Doppler studies of the umbilical arteries could not be obtained for twin A due to the fetal position.    Twin B: Appropriate fetal growth.  The views of the fetal anatomy were limited today due to the fetal position.  However, no obvious fetal anomalies were noted.  There was normal amniotic fluid noted around both fetuses today.  The following were discussed with the patient today:  Hydrops of twin A  The various causes of fetal hydrops  including a heart defect, chromosomal abnormalities, isoimmunization, various infections, fetal chest masses, or fetal tumors and other metabolic diseases in the fetus were discussed.  The views of the fetal heart were unable to be visualized today.  Therefore, a structural cardiac anomaly has not been ruled out.  The patient's blood type is O+ and her antibody screen did not indicate any abnormal antibodies, making isoimmunization less likely. Doppler studies of the middle cerebral arteries performed today did not indicate anemia in twin A.   The patient had her blood drawn today to screen for a recent parvovirus, toxoplasmosis, and CMV infection.    There were no signs of a fetal chest mass or fetal tumors noted on today's exam.  The patient was offered and declined an amniocentesis for definitive diagnosis of fetal aneuploidy.  She is comfortable with her negative cell free DNA test.  The poor prognosis for survival due to fetal hydrops noted that such an early gestational age was discussed.  She was advised regarding the high risk of an IUFD of twin A.  She was advised that as this is most likely a dichorionic, diamniotic twin gestation, the demise of one fetus should not affect the other fetus.  Management options including an elective termination of pregnancy were discussed.  The patient has decided to continue the pregnancy.  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  Another indirect sign of agenesis of the corpus callosum showing increased separation of the hemispheres along with a midline interhemispheric cyst/dilated third ventricle were also noted today.  The lateral ventricles appeared to be dilated today further increasing the suspicion of agenesis of the corpus callosum.  The patient was advised  that the prognosis for children born with agenesis of the corpus callosum vary widely from asymptomatic with normal intellectual capacities to children with neurodevelopmental delays, motor defects, and seizure disorders.  The prognosis cannot be determined until after the child is born.  Chromosomal abnormalities (especially trisomy 39 and 13) are present in 20% of cases of agenesis of the corpus callosum.  The increased risk of genetic syndromes that cannot be diagnosed via prenatal ultrasounds associated with agenesis of corpus callosum was also discussed.  The patient was offered and declined an amniocentesis today for definitive diagnosis of fetal aneuploidy.  She is comfortable with her negative cell free DNA test.  We will consider a fetal MRI later in her pregnancy should twin A survive to that time.  Elevated MSAFP (8.49 MoM)  The patient was advised of the ultrasound findings that failed to reveal an anatomical cause for the increased MSAFP.  There were no sonographic signs of spina bifida or an anterior abdominal wall defect noted today in either fetus. However, the views of the fetal anatomywere extremely limited today.  She was advised regarding the limitations of ultrasound in the detection of all anomalies and that it will diagnose approximately 90% of neural tube defects.    The association of an elevated MSAFP with placental dysfunction which may manifest later in her pregnancy as fetal growth restriction along with with other adverse pregnancy outcomes such as fetal demise was discussed.  The patient was overwhelmed by what was discussed with her today.  Therefore, I have scheduled a consultation with our genetic counselor early next week to provide her with more information.  We will continue to follow her with serial ultrasound exams for fetal assessment.    Another ultrasound was scheduled for her in 1 week.  The patient stated that she understood the implications of what was  discussed with her today.    All conversations were held with the patient today with the help of a Spanish interpreter.  A total of 45 minutes was spent counseling and coordinating the care for this patient.  Greater than 50% of the time was spent in direct face-to-face contact.

## 2021-02-09 ENCOUNTER — Other Ambulatory Visit: Payer: Self-pay | Admitting: *Deleted

## 2021-02-09 ENCOUNTER — Encounter: Payer: Self-pay | Admitting: Family Medicine

## 2021-02-09 DIAGNOSIS — O30049 Twin pregnancy, dichorionic/diamniotic, unspecified trimester: Secondary | ICD-10-CM

## 2021-02-09 DIAGNOSIS — O283 Abnormal ultrasonic finding on antenatal screening of mother: Secondary | ICD-10-CM | POA: Insufficient documentation

## 2021-02-10 ENCOUNTER — Ambulatory Visit: Payer: Self-pay

## 2021-02-10 ENCOUNTER — Ambulatory Visit: Payer: Self-pay | Attending: Obstetrics

## 2021-02-10 ENCOUNTER — Other Ambulatory Visit: Payer: Self-pay

## 2021-02-10 DIAGNOSIS — O283 Abnormal ultrasonic finding on antenatal screening of mother: Secondary | ICD-10-CM

## 2021-02-10 DIAGNOSIS — O280 Abnormal hematological finding on antenatal screening of mother: Secondary | ICD-10-CM

## 2021-02-10 NOTE — Progress Notes (Signed)
Amy Prince was referred for genetic counseling to review results of her ultrasound on 02/06/21 and to discuss testing options for this pregnancy.  The patient was present at the visit with her partner, Marijean Niemann, and a [place] interpreter.    The ultrasound on 02/06/21 at [redacted] weeks gestation revealed multiple findings in this dichorionic, diamniotic twin pregnancy. The growth and anatomy of Twin B was normal. Twin A was found to have fetal hydrops, an absent cavum septum pellucidum and borderline ventriculomegaly.  The maternal serum AFP was also significantly elevated.  See the ultrasound report for further details.    We reviewed the ultrasound findings:Twin A was noted to have scalp edema, abdominal ascites and marked skin edema.  We discussed that babies with this combination of excess fluid in the body are said to have a condition called hydrops.  Hydrops can happen for many reasons including a chromosome condition, structural heart defect, genetic syndrome, fetal anemia, infections and other causes.  The excess fluid may remain the same, worsen or may resolve as the pregnancy progresses.  The presence of hydrops also increases the concern for fetal demise during the pregnancy, particularly when it is this significant at this early of a gestational age.  The finding of the absent cavum pellucidum can indicate possible agenesis of the corpus callosum.  We also visualized increased separation of the hemispheres with dilated third ventricle as well as dilated lateral ventricles, which further suggests agenesis of the corpus callosum (ACC).  ACC is a finding that can be associated with chromosome conditions and single gene disorders or may be present as an isolated finding.  The prognosis is highly variable, from no discernable effects to a combination of developmental delays, seizures, vision and motor effects.  Without a known underlying genetic syndrome, it is not possible to determine prior to birth how  this may present in the baby.    The presence of more than one finding on ultrasound may further increase the chance for an underlying chromosomal or genetic cause.  Prior testing through [facility] for trisomy 13, 18 and 21 was negative. 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 This reduces the chance for those common chromosome conditions, but cannot determine the chance for other aneuploidies and is not diagnostic.  To help provide additional information, we talked about the option of amniocentesis.  We reviewed the risks, benefits and limitations of this testing in a twin gestation.  Testing on cells from the amniocentesis could include chromosome analysis as well as chromosomal microarray to assess for small deletions and duplications of genetic material.  No testing can detect all genetic conditions or syndromes, nor can it predict the severity of all conditions for which we can provide testing.  The risk for complications including miscarriage is estimated to be 1 in 500.  The couple also inquired about termination of pregnancy.  Specifically, they are considering selective reduction of twin A. Given the late gestational age (14 weeks), this is not an option in [place].  We are inquiring about this option in nearby states with later termination laws and will follow up with the patient this week.  Lastly, the elevated msAFP was reviewed.  Increased levels of AFP occur for many reasons including: inaccurate pregnancy dating, pregnancy complications, neural tube defects, abdominal wall defects or a normal pregnancy. No evidence of an open neural tube defect or abdominal wall defect were seen  on the ultrasound.  Bleeding or trauma to the maternal abdomen can also result in elevated AFP.  The patient denies this in the pregnancy. It is possible that this finding reflects an abnormal placenta or impending  fetal demise, though it is difficult to determine the exact cause.  We would recommend following fetal growth throughout the pregnancy given this finding.  We also obtained a family history and pregnancy history. The patient reported no complications or exposures in this pregnancy to alcohol, tobacco, recreational drugs or medications.  The couple has two health sons, ages 54 and 2 years.  The remainder of the family history is unremarkable for birth defects, developmental delays, recurrent pregnancy loss or known genetic conditions.    Plan of care: Awaiting results of CMV, toxo and parvovirus testing drawn 8/22 (sample had to be repeated due to lab issue). Follow up ultrasound this Friday, 02/13/21. Schedule fetal echocardiogram and fetal MRI if patient continues the pregnancy. The couple is considering fetal reduction/termination of affected twin.  I will make calls to several clinics out of state to determine this possibility and cost. Declines amniocentesis. Testing can be offered including a genetics consultation at the time of birth if Twin A survives to delivery.  We appreciated being involved in the care of this patient and can be reached at [phone].  Cherly Anderson, MS, CGC

## 2021-02-11 ENCOUNTER — Telehealth: Payer: Self-pay | Admitting: Obstetrics and Gynecology

## 2021-02-11 LAB — CMV ANTIBODY, IGG (EIA): CMV Ab - IgG: 8.3 U/mL — ABNORMAL HIGH (ref 0.00–0.59)

## 2021-02-11 LAB — TOXOPLASMA GONDII ANTIBODY, IGM: Toxoplasma Antibody- IgM: 9.1 AU/mL — ABNORMAL HIGH (ref 0.0–7.9)

## 2021-02-11 LAB — PARVOVIRUS B19 ANTIBODY, IGG AND IGM
Parvovirus B19 IgG: 1.5 index — ABNORMAL HIGH (ref 0.0–0.8)
Parvovirus B19 IgM: 0.2 index (ref 0.0–0.8)

## 2021-02-11 LAB — INFECT DISEASE AB IGM REFLEX 2

## 2021-02-11 LAB — CMV IGM: CMV IgM Ser EIA-aCnc: <30 AU/mL (ref 0.0–29.9)

## 2021-02-11 LAB — TOXOPLASMA GONDII ANTIBODY, IGG: Toxoplasma IgG Ratio: 84.3 IU/mL — ABNORMAL HIGH (ref 0.0–7.1)

## 2021-02-11 NOTE — Telephone Encounter (Signed)
I spoke with [name] with the aid of a Spanish interpreter to review the results of toxoplasmosis, CMV and parvovirus testing.   Results showed positive IgG for CMV and parvovirus, indicating past infection.  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  [facility] will now send the sample out for avidity testing to further determine the timing of the infection and possible option for treatment.   I also informed the patient that selective reduction of the pregnancy is not an option at [facility] in [place].  I will reach out to a couple of other locations to determine if this is possible elsewhere.  She is scheduled to return to clinic on Friday for a follow up ultrasound.  Cherly Anderson, MS, CGC

## 2021-02-13 ENCOUNTER — Other Ambulatory Visit: Payer: Self-pay

## 2021-02-13 ENCOUNTER — Other Ambulatory Visit: Payer: Self-pay | Admitting: Obstetrics

## 2021-02-13 ENCOUNTER — Ambulatory Visit: Payer: Self-pay | Admitting: *Deleted

## 2021-02-13 ENCOUNTER — Ambulatory Visit: Payer: Self-pay | Attending: Obstetrics

## 2021-02-13 VITALS — BP 109/74 | HR 103

## 2021-02-13 DIAGNOSIS — O30049 Twin pregnancy, dichorionic/diamniotic, unspecified trimester: Secondary | ICD-10-CM

## 2021-02-13 DIAGNOSIS — O4692 Antepartum hemorrhage, unspecified, second trimester: Secondary | ICD-10-CM

## 2021-02-13 DIAGNOSIS — O09292 Supervision of pregnancy with other poor reproductive or obstetric history, second trimester: Secondary | ICD-10-CM

## 2021-02-13 DIAGNOSIS — O30042 Twin pregnancy, dichorionic/diamniotic, second trimester: Secondary | ICD-10-CM | POA: Insufficient documentation

## 2021-02-13 DIAGNOSIS — O3622X1 Maternal care for hydrops fetalis, second trimester, fetus 1: Secondary | ICD-10-CM | POA: Insufficient documentation

## 2021-02-13 DIAGNOSIS — O280 Abnormal hematological finding on antenatal screening of mother: Secondary | ICD-10-CM | POA: Insufficient documentation

## 2021-02-13 DIAGNOSIS — Z3A23 23 weeks gestation of pregnancy: Secondary | ICD-10-CM | POA: Insufficient documentation

## 2021-02-13 DIAGNOSIS — O0990 Supervision of high risk pregnancy, unspecified, unspecified trimester: Secondary | ICD-10-CM | POA: Insufficient documentation

## 2021-02-16 ENCOUNTER — Other Ambulatory Visit: Payer: Self-pay | Admitting: *Deleted

## 2021-02-16 ENCOUNTER — Other Ambulatory Visit: Payer: Self-pay

## 2021-02-16 DIAGNOSIS — O30002 Twin pregnancy, unspecified number of placenta and unspecified number of amniotic sacs, second trimester: Secondary | ICD-10-CM

## 2021-02-16 DIAGNOSIS — Z3681 Encounter for antenatal screening for hydrops fetalis: Secondary | ICD-10-CM

## 2021-02-20 ENCOUNTER — Ambulatory Visit: Payer: Self-pay | Attending: Obstetrics

## 2021-02-20 ENCOUNTER — Encounter: Payer: Self-pay | Admitting: *Deleted

## 2021-02-20 ENCOUNTER — Other Ambulatory Visit: Payer: Self-pay

## 2021-02-20 ENCOUNTER — Ambulatory Visit: Payer: Self-pay | Admitting: *Deleted

## 2021-02-20 VITALS — BP 106/64 | HR 93

## 2021-02-20 DIAGNOSIS — O280 Abnormal hematological finding on antenatal screening of mother: Secondary | ICD-10-CM | POA: Diagnosis present

## 2021-02-20 DIAGNOSIS — O30042 Twin pregnancy, dichorionic/diamniotic, second trimester: Secondary | ICD-10-CM

## 2021-02-20 DIAGNOSIS — Z3681 Encounter for antenatal screening for hydrops fetalis: Secondary | ICD-10-CM | POA: Diagnosis present

## 2021-02-20 DIAGNOSIS — O4692 Antepartum hemorrhage, unspecified, second trimester: Secondary | ICD-10-CM | POA: Insufficient documentation

## 2021-02-20 DIAGNOSIS — O99212 Obesity complicating pregnancy, second trimester: Secondary | ICD-10-CM

## 2021-02-20 DIAGNOSIS — O0990 Supervision of high risk pregnancy, unspecified, unspecified trimester: Secondary | ICD-10-CM

## 2021-02-20 DIAGNOSIS — O30002 Twin pregnancy, unspecified number of placenta and unspecified number of amniotic sacs, second trimester: Secondary | ICD-10-CM | POA: Diagnosis not present

## 2021-02-20 DIAGNOSIS — E669 Obesity, unspecified: Secondary | ICD-10-CM

## 2021-02-20 DIAGNOSIS — O09212 Supervision of pregnancy with history of pre-term labor, second trimester: Secondary | ICD-10-CM

## 2021-02-20 DIAGNOSIS — O3622X1 Maternal care for hydrops fetalis, second trimester, fetus 1: Secondary | ICD-10-CM

## 2021-02-20 DIAGNOSIS — Z3A24 24 weeks gestation of pregnancy: Secondary | ICD-10-CM

## 2021-02-25 ENCOUNTER — Telehealth: Payer: Self-pay

## 2021-02-25 ENCOUNTER — Other Ambulatory Visit: Payer: Self-pay

## 2021-02-25 NOTE — Telephone Encounter (Signed)
FETAL ECHO SCHEDULED FOR 02/27/2021@9A .

## 2021-02-26 ENCOUNTER — Other Ambulatory Visit: Payer: Self-pay | Admitting: Obstetrics

## 2021-02-26 ENCOUNTER — Encounter: Payer: Self-pay | Admitting: *Deleted

## 2021-02-26 ENCOUNTER — Other Ambulatory Visit: Payer: Self-pay

## 2021-02-26 ENCOUNTER — Ambulatory Visit: Payer: Self-pay | Admitting: *Deleted

## 2021-02-26 ENCOUNTER — Ambulatory Visit: Payer: Self-pay | Attending: Obstetrics

## 2021-02-26 VITALS — BP 115/61 | HR 93

## 2021-02-26 DIAGNOSIS — O09292 Supervision of pregnancy with other poor reproductive or obstetric history, second trimester: Secondary | ICD-10-CM

## 2021-02-26 DIAGNOSIS — O280 Abnormal hematological finding on antenatal screening of mother: Secondary | ICD-10-CM | POA: Insufficient documentation

## 2021-02-26 DIAGNOSIS — E669 Obesity, unspecified: Secondary | ICD-10-CM

## 2021-02-26 DIAGNOSIS — O09212 Supervision of pregnancy with history of pre-term labor, second trimester: Secondary | ICD-10-CM

## 2021-02-26 DIAGNOSIS — O30049 Twin pregnancy, dichorionic/diamniotic, unspecified trimester: Secondary | ICD-10-CM | POA: Insufficient documentation

## 2021-02-26 DIAGNOSIS — O99212 Obesity complicating pregnancy, second trimester: Secondary | ICD-10-CM

## 2021-02-26 DIAGNOSIS — O4692 Antepartum hemorrhage, unspecified, second trimester: Secondary | ICD-10-CM

## 2021-02-26 DIAGNOSIS — O30042 Twin pregnancy, dichorionic/diamniotic, second trimester: Secondary | ICD-10-CM

## 2021-02-26 DIAGNOSIS — O3622X0 Maternal care for hydrops fetalis, second trimester, not applicable or unspecified: Secondary | ICD-10-CM

## 2021-02-26 DIAGNOSIS — O0990 Supervision of high risk pregnancy, unspecified, unspecified trimester: Secondary | ICD-10-CM

## 2021-02-26 DIAGNOSIS — Z3A25 25 weeks gestation of pregnancy: Secondary | ICD-10-CM

## 2021-02-26 LAB — TOXOPLASMA PREG PL (>16 WEEKS)
IgG (Dye Test): 1:512 {titer}
IgM ELISA: 0.4
Interpretation: NEGATIVE
Interpretation: POSITIVE — AB

## 2021-02-26 LAB — SPECIMEN STATUS REPORT

## 2021-03-04 ENCOUNTER — Other Ambulatory Visit: Payer: Self-pay

## 2021-03-05 ENCOUNTER — Other Ambulatory Visit: Payer: Self-pay

## 2021-03-05 ENCOUNTER — Encounter (HOSPITAL_COMMUNITY): Payer: Self-pay | Admitting: Obstetrics & Gynecology

## 2021-03-05 ENCOUNTER — Inpatient Hospital Stay (HOSPITAL_COMMUNITY)
Admission: AD | Admit: 2021-03-05 | Discharge: 2021-03-11 | DRG: 787 | Disposition: A | Discharge status: Home / Self Care | Payer: Medicaid Other | Attending: Obstetrics and Gynecology | Admitting: Obstetrics and Gynecology

## 2021-03-05 ENCOUNTER — Inpatient Hospital Stay (HOSPITAL_BASED_OUTPATIENT_CLINIC_OR_DEPARTMENT_OTHER): Payer: Medicaid Other

## 2021-03-05 DIAGNOSIS — Z3A26 26 weeks gestation of pregnancy: Secondary | ICD-10-CM

## 2021-03-05 DIAGNOSIS — O42919 Preterm premature rupture of membranes, unspecified as to length of time between rupture and onset of labor, unspecified trimester: Secondary | ICD-10-CM | POA: Diagnosis present

## 2021-03-05 DIAGNOSIS — Z23 Encounter for immunization: Secondary | ICD-10-CM

## 2021-03-05 DIAGNOSIS — O99212 Obesity complicating pregnancy, second trimester: Secondary | ICD-10-CM

## 2021-03-05 DIAGNOSIS — O321XX2 Maternal care for breech presentation, fetus 2: Secondary | ICD-10-CM | POA: Diagnosis present

## 2021-03-05 DIAGNOSIS — Z98891 History of uterine scar from previous surgery: Secondary | ICD-10-CM

## 2021-03-05 DIAGNOSIS — O3622X1 Maternal care for hydrops fetalis, second trimester, fetus 1: Secondary | ICD-10-CM | POA: Diagnosis present

## 2021-03-05 DIAGNOSIS — O42912 Preterm premature rupture of membranes, unspecified as to length of time between rupture and onset of labor, second trimester: Secondary | ICD-10-CM | POA: Diagnosis present

## 2021-03-05 DIAGNOSIS — O364XX1 Maternal care for intrauterine death, fetus 1: Secondary | ICD-10-CM | POA: Diagnosis present

## 2021-03-05 DIAGNOSIS — O30042 Twin pregnancy, dichorionic/diamniotic, second trimester: Secondary | ICD-10-CM

## 2021-03-05 DIAGNOSIS — Z20822 Contact with and (suspected) exposure to covid-19: Secondary | ICD-10-CM | POA: Diagnosis present

## 2021-03-05 DIAGNOSIS — O09292 Supervision of pregnancy with other poor reproductive or obstetric history, second trimester: Secondary | ICD-10-CM

## 2021-03-05 DIAGNOSIS — O99214 Obesity complicating childbirth: Secondary | ICD-10-CM | POA: Diagnosis present

## 2021-03-05 DIAGNOSIS — O358XX1 Maternal care for other (suspected) fetal abnormality and damage, fetus 1: Secondary | ICD-10-CM | POA: Diagnosis present

## 2021-03-05 DIAGNOSIS — E669 Obesity, unspecified: Secondary | ICD-10-CM

## 2021-03-05 DIAGNOSIS — O09212 Supervision of pregnancy with history of pre-term labor, second trimester: Secondary | ICD-10-CM

## 2021-03-05 LAB — COMPREHENSIVE METABOLIC PANEL
ALT: 25 U/L (ref 0–44)
AST: 25 U/L (ref 15–41)
Albumin: 2.2 g/dL — ABNORMAL LOW (ref 3.5–5.0)
Alkaline Phosphatase: 180 U/L — ABNORMAL HIGH (ref 38–126)
Anion gap: 11 (ref 5–15)
BUN: 6 mg/dL (ref 6–20)
CO2: 22 mmol/L (ref 22–32)
Calcium: 10.2 mg/dL (ref 8.9–10.3)
Chloride: 104 mmol/L (ref 98–111)
Creatinine, Ser: 0.5 mg/dL (ref 0.44–1.00)
GFR, Estimated: >60 mL/min (ref >60)
Glucose, Bld: 92 mg/dL (ref 70–99)
Potassium: 4 mmol/L (ref 3.5–5.1)
Sodium: 137 mmol/L (ref 135–145)
Total Bilirubin: 0.6 mg/dL (ref 0.3–1.2)
Total Protein: 6.4 g/dL — ABNORMAL LOW (ref 6.5–8.1)

## 2021-03-05 LAB — WET PREP, GENITAL
Clue Cells Wet Prep HPF POC: NONE SEEN
Sperm: NONE SEEN
Trich, Wet Prep: NONE SEEN
Yeast Wet Prep HPF POC: NONE SEEN

## 2021-03-05 LAB — CBC WITH DIFFERENTIAL/PLATELET
Abs Immature Granulocytes: 0.07 10*3/uL (ref 0.00–0.07)
Basophils Absolute: 0 10*3/uL (ref 0.0–0.1)
Basophils Relative: 0 %
Eosinophils Absolute: 0.1 10*3/uL (ref 0.0–0.5)
Eosinophils Relative: 1 %
HCT: 36.8 % (ref 36.0–46.0)
Hemoglobin: 12.4 g/dL (ref 12.0–15.0)
Immature Granulocytes: 1 %
Lymphocytes Relative: 23 %
Lymphs Abs: 1.8 10*3/uL (ref 0.7–4.0)
MCH: 30.5 pg (ref 26.0–34.0)
MCHC: 33.7 g/dL (ref 30.0–36.0)
MCV: 90.4 fL (ref 80.0–100.0)
Monocytes Absolute: 0.6 10*3/uL (ref 0.1–1.0)
Monocytes Relative: 7 %
Neutro Abs: 5.5 10*3/uL (ref 1.7–7.7)
Neutrophils Relative %: 68 %
Platelets: 250 10*3/uL (ref 150–400)
RBC: 4.07 MIL/uL (ref 3.87–5.11)
RDW: 13.6 % (ref 11.5–15.5)
WBC: 8 10*3/uL (ref 4.0–10.5)
nRBC: 0 % (ref 0.0–0.2)

## 2021-03-05 LAB — RESP PANEL BY RT-PCR (FLU A&B, COVID) ARPGX2
Influenza A by PCR: NEGATIVE
Influenza B by PCR: NEGATIVE
SARS Coronavirus 2 by RT PCR: NEGATIVE

## 2021-03-05 MED ORDER — AMOXICILLIN 500 MG PO CAPS
500.0000 mg | ORAL_CAPSULE | Freq: Three times a day (TID) | ORAL | Status: DC
  Administered 2021-03-07 – 2021-03-08 (×6): 500 mg via ORAL
  Filled 2021-03-05 (×7): qty 1

## 2021-03-05 MED ORDER — ZOLPIDEM TARTRATE 5 MG PO TABS: 5.0000 mg | ORAL_TABLET | Freq: Every evening | ORAL | Status: DC | PRN

## 2021-03-05 MED ORDER — AZITHROMYCIN 250 MG PO TABS
1000.0000 mg | ORAL_TABLET | Freq: Once | ORAL | Status: AC
  Administered 2021-03-05: 1000 mg via ORAL
  Filled 2021-03-05: qty 4

## 2021-03-05 MED ORDER — PRENATAL MULTIVITAMIN CH
1.0000 | ORAL_TABLET | Freq: Every day | ORAL | Status: DC
  Administered 2021-03-05 – 2021-03-08 (×4): 1 via ORAL
  Filled 2021-03-05 (×4): qty 1

## 2021-03-05 MED ORDER — ACETAMINOPHEN 325 MG PO TABS
650.0000 mg | ORAL_TABLET | ORAL | Status: DC | PRN
  Administered 2021-03-05 – 2021-03-11 (×7): 650 mg via ORAL
  Filled 2021-03-05 (×7): qty 2

## 2021-03-05 MED ORDER — CALCIUM CARBONATE ANTACID 500 MG PO CHEW: 2.0000 | CHEWABLE_TABLET | ORAL | Status: DC | PRN

## 2021-03-05 MED ORDER — LACTATED RINGERS IV SOLN
INTRAVENOUS | Status: DC
  Administered 2021-03-05: 50 mL/h via INTRAVENOUS

## 2021-03-05 MED ORDER — SODIUM CHLORIDE 0.9 % IV SOLN
2.0000 g | Freq: Four times a day (QID) | INTRAVENOUS | Status: AC
  Administered 2021-03-05 – 2021-03-07 (×8): 2 g via INTRAVENOUS
  Filled 2021-03-05 (×8): qty 2000

## 2021-03-05 MED ORDER — MAGNESIUM SULFATE BOLUS VIA INFUSION
4.0000 g | Freq: Once | INTRAVENOUS | Status: AC
  Administered 2021-03-05: 4 g via INTRAVENOUS
  Filled 2021-03-05: qty 1000

## 2021-03-05 MED ORDER — DOCUSATE SODIUM 100 MG PO CAPS
100.0000 mg | ORAL_CAPSULE | Freq: Every day | ORAL | Status: DC
  Administered 2021-03-05 – 2021-03-06 (×2): 100 mg via ORAL
  Filled 2021-03-05 (×2): qty 1

## 2021-03-05 MED ORDER — MAGNESIUM SULFATE 40 GM/1000ML IV SOLN
2.0000 g/h | INTRAVENOUS | Status: DC
  Administered 2021-03-05 – 2021-03-06 (×3): 2 g/h via INTRAVENOUS
  Filled 2021-03-05 (×3): qty 1000

## 2021-03-05 MED ORDER — BETAMETHASONE SOD PHOS & ACET 6 (3-3) MG/ML IJ SUSP
12.0000 mg | INTRAMUSCULAR | Status: AC
  Administered 2021-03-05 – 2021-03-06 (×2): 12 mg via INTRAMUSCULAR
  Filled 2021-03-05 (×2): qty 5

## 2021-03-05 NOTE — Consult Note (Signed)
Consultation Service: Neonatology   Dr. Annamaria Boots has asked for consultation on [name] regarding the care of a premature twin infants at [redacted]w[redacted]d. Thank you for inviting [place] to see this patient.   Reason for consult:  Explain the possible complications, the prognosis, and the care of a premature infants at [redacted]w[redacted]d. COmplicated by fetal hydrops of twin A now with PPROM.  Chief complaint: 25yo female with a di-di IUTP now with PPROM of Twin A. Twin A with hydrops.   My key findings of this patient's HPI are:  I have reviewed the patient's chart and have met with her. The salient information is as follows:   Prenatal care:   good Pregnancy complications:  multiple gestation, PPROM, Twin A with hydrops and breech/breech presentation Maternal Steroids: 9/15 first dose  Her pregnancy has also been complicated by an unexplained fetal hydrops/anasarca remains undetermined.  The patient's IgM/IgG for toxoplasmosis were elevated.  However, the toxoplasmosis IgG avidity test panel did not indicate an acute toxoplasmosis infection. Fetal ECHOs normal. Trisomy 13,18, and 21 testing has been negative. Fetal ultrasound of Twin A has shown hydrops/anasarca with pericardial effusion, abdominal and thoracic ascites along with skin edema of the scalp, thorax, and abdomen. This has been present over the past 4 weeks and is worsening. Twin A with absent cavum septum pellucidum. Twin A now having variable decelerations. Twin B has had normal appearing ultrasound with EFW of 835 g. The fetal heart rate tracing for twin B is reactive for her gestational age.   My recommendations for this patient and my actions included:   1. In the presence of the [name] and her partner, I spent 50 minutes discussing the possible complications and outcomes of prematurity at this gestational age.I discussed the potential need for resuscitation at birth, mechanical ventilation and surfactant administration for respiratory  distress, IV fluids pending establishment of enteral feeds (encouraged breast milk feeding to which she planned to do), antibiotics for possible sepsis, temperature support, and monitoring. I also discussed the potential risk of complications such as intracranial hemorrhage, retinopathy, hearing deficit, and chronic lung disease. I also discussed the potential length of stay in the neonatal intensive care unit for infants at this gestational age. I discussed this with parents in detail and they expressed an understanding of the risks and complications of prematurity.   2. I also discussed the expected survival of an infants born at 83 weeks. We further discussed the neurological complications. She expressed an understanding of this information.   3. In addition we discussed the increased probability of poor prognosis for Twin A given the ongoing hydrops, risks of pulmonary hypoplasia and poor cardiac function with the increasing pericardial effusion. We did discuss the need for invasive treatments for Twin A including intubation, chest tube placement, cardiac needle decompression, blood pressure support with pressors and need for invasive monitoring.   4. With this information, the family has decide that they would like to do everything to optimize the outcome for twin B and at this time would not like to intervene with delivery for nonreassuring fetal status to prevent a fetal demise of twin A. I have informed them that they may change this decision at any time. I informed her that the NICU team would be present at the delivery regardless of when she delivers. She agreed that all appropriate medical measures could be taken to resuscitate her infants at the delivery including twin A if a IUFD has not occurred.  Davonna Belling,  MD

## 2021-03-05 NOTE — MAU Provider Note (Signed)
Chief Complaint:  Rupture of Membranes   Event Date/Time   First Provider Initiated Contact with Patient 03/05/21 [phone]     HPI: Amy Prince is a 25 y.o. [id number] at 44w2dwho presents to maternity admissions reporting leaking fluid since 0600.  Does not feel contractions. She reports good fetal movement, denies vaginal bleeding, vaginal itching/burning, urinary symptoms, h/a, dizziness, n/v, diarrhea, constipation or fever/chills.    Pregnancy has been followed at [facility] and remarkable for Di-Di Twins, Hydrops in Twin A, elevated AFP, history of preeclampsia and history of PPH.  Vaginal Discharge The patient's primary symptoms include vaginal discharge. The patient's pertinent negatives include no genital itching, genital lesions, genital odor or pelvic pain. This is a new problem. The current episode started today. The problem occurs constantly. She is pregnant. Pertinent negatives include no abdominal pain, back pain, chills, constipation, diarrhea or fever. The vaginal discharge was clear and watery. There has been no bleeding. She has not been passing clots. She has not been passing tissue. Nothing aggravates the symptoms. She has tried nothing for the symptoms.   RN Note: Amy KitchenMarland KitchenJulyssa Orland Prince is a 25 y.o. at [redacted]w[redacted]d here in MAU reporting: SROM at 0600, clear fluid. Denies vaginal bleeding. Denies contractions.   Pain score: 0/10   Past Medical History: Past Medical History:  Diagnosis Date   History of pre-eclampsia    Medical history non-contributory     Past obstetric history: OB History  Gravida Para Term Preterm AB Living  4 2 1 1 1 2   SAB IAB Ectopic Multiple Live Births  1     0 2    # Outcome Date GA Lbr Len/2nd Weight Sex Delivery Anes PTL Lv  4 Current           3 Term 03/30/19 [redacted]w[redacted]d 02:50 / 00:16 3865 g M Vag-Spont None  LIV     Complications: Postpartum hemorrhage  2 SAB 2018 [redacted]w[redacted]d         1 Preterm 11/25/11 [redacted]w[redacted]d  2600 g M Vag-Spont None Y LIV     Birth  Comments: induced at 36 weeks for preeclampsia     Complications: Preeclampsia    Past Surgical History: Past Surgical History:  Procedure Laterality Date   CHOLECYSTECTOMY OPEN  2013    Family History: Family History  Problem Relation Age of Onset   Kidney disease Father     Social History: Social History   Tobacco Use   Smoking status: Never   Smokeless tobacco: Never  Vaping Use   Vaping Use: Never used  Substance Use Topics   Alcohol use: Not Currently    Comment: Social   Drug use: Never    Comment: Social    Allergies: No Known Allergies  Meds:  Medications Prior to Admission  Medication Sig Dispense Refill Last Dose   aspirin 81 MG chewable tablet Chew 81 mg by mouth daily.   03/04/2021   Prenatal Vit-Fe Fumarate-FA (PRENATAL MULTIVITAMIN) TABS tablet Take 1 tablet by mouth daily at 12 noon. 30 tablet 11 03/04/2021    I have reviewed patient's Past Medical Hx, Surgical Hx, Family Hx, Social Hx, medications and allergies.   ROS:  Review of Systems  Constitutional:  Negative for chills and fever.  Gastrointestinal:  Negative for abdominal pain, constipation and diarrhea.  Genitourinary:  Positive for vaginal discharge. Negative for pelvic pain.  Musculoskeletal:  Negative for back pain.  Other systems negative  Physical Exam  Patient Vitals for the past 24 hrs:  BP Temp Temp src Pulse Resp SpO2  03/05/21 0705 118/64 98.6 F (37 C) Oral 100 17 99 %   Constitutional: Well-developed, well-nourished female in no acute distress.  Cardiovascular: normal rate and rhythm Respiratory: normal effort, clear to auscultation bilaterally GI: Abd soft, non-tender, gravid appropriate for gestational age.   No rebound or guarding. MS: Extremities nontender, no edema, normal ROM Neurologic: Alert and oriented x 4.  GU: Neg CVAT.  PELVIC EXAM:    FHT:  Baseline 140/145 , moderate variability, accelerations present, no decelerations in Twin B, large variable decels  twin A Contractions:  Irregular     Labs: No results found for this or any previous visit (from the past 24 hour(s)). O/Positive/-- (06/02 1113)  Imaging:  Dopplers and [place] results pending  MAU Course/MDM: I have ordered labs and routine admission orders.  Cultures obtained. NST reviewed, reassuring for twin B, decels twin A Consult Dr Macon Large with presentation, exam findings and test results.  Treatments in MAU included EFM, [place].    Assessment: Di-Di Twin Gestation at [redacted]w[redacted]d PPROM  Plan: Plan per MDs Dr Mosetta Putt to come and talk with patient re: plan    Wynelle Bourgeois CNM, MSN Certified Nurse-Midwife 03/05/2021 7:27 AM

## 2021-03-05 NOTE — MAU Note (Signed)
..  Amy Prince is a 25 y.o. at [redacted]w[redacted]d here in MAU reporting: SROM at 0600, clear fluid. Denies vaginal bleeding. Denies contractions.   Pain score: 0/10 Vitals:   03/05/21 0705  BP: 118/64  Pulse: 100  Resp: 17  Temp: 98.6 F (37 C)  SpO2: 99%

## 2021-03-05 NOTE — Consult Note (Addendum)
MFM Note  Amy Prince is a 25 year old gravida 4 para 1-1-1-2 currently at 26 weeks and 2 days with a dichorionic, diamniotic twin gestation where fetal hydrops/anasarca has been noted in twin A.    She was seen in consultation today due to PPROM of twin A.   Her pregnancy has also been complicated by an unexplained elevated MSAFP of 8.49 MoM.  The cause of fetal hydrops/anasarca remains undetermined.  The patient's IgM/IgG for toxoplasmosis were elevated.  However, the toxoplasmosis IgG avidity test panel that was sent to the [facility] lab in [facility] did not indicate an acute toxoplasmosis infection.  Rupture of membranes for twin A was confirmed in the MAU.    A limited ultrasound performed today shows anhydramnios around twin A.  Fetal hydrops/anasarca with abdominal and thoracic ascites along with skin edema of the scalp, thorax, and abdomen continues to be noted twin A.  The hydrops has continued to worsen over the past 4 weeks in twin A.  The fetal heart rate tracing for twin A (the hydropic fetus) shows intermittent variable decelerations.  The fetal heart rate tracing for twin B (the normal-appearing fetus) is reactive for her gestational age.  Frequent contractions every 2 to 4 minutes are noted on the toco.  Due to PPROM, the patient was given a course of antenatal corticosteroids, started on latency antibiotics, and started on magnesium sulfate for tocolysis and fetal neuroprotection.  The typical management of patients with PPROM was reviewed.     The patient was advised that due to PPROM, she will remain in the hospital until delivery. She should be placed on latency antibiotics for 7 days and receive a complete course of antenatal corticosteroids.   She should be kept on magnesium sulfate for tocolysis and fetal neuro protection until her steroid course is completed.  Magnesium sulfate for fetal neuroprotection should be restarted should she be at risk of  delivery prior to 32 weeks.  The poor prognosis for twin A (the hydropic fetus) was discussed with the patient and her partner.  They were advised that there may be limited treatment options for that baby even after delivery.  I discussed her case with the NICU.  They stated that as fetal hydrops/anasarca has been noted for a prolonged period of time, the prognosis for that baby is probably not good.  They will come and speak with the patient to discuss treatment options for the baby after delivery.  As recurrent variable decelerations have been noted in twin A (the hydropic fetus), the patient was asked if she would want [place] to proceed with delivery for nonreassuring fetal status to prevent a fetal demise of twin A.  Delivery for the benefit of twin A would subject twin B (the normal-appearing fetus) to the effects of extreme prematurity.    As there are probably limited treatment options for twin A after delivery and as there is no guarantee of survival even with treatment, the patient and her partner stated that they would like to do everything possible to give twin B (the normal-appearing fetus) the best possible chances of having a good long-term outcome, including allowing an IUFD of twin A to occur.  The patient and her partner were advised that this is a very difficult ethical dilemma.  They may still change their minds after speaking with the NICU.  As the couple would not want to proceed with delivery for nonreassuring fetal status of twin A at this time, twin A may  be taken off continuous monitoring.  Continuous monitoring will be kept for twin B.  The patient and her partner were advised that as this is a dichorionic twin gestation, the demise of one twin will probably not have any effects on the surviving twin.  We will continue the usual treatment plan for patient's that have ruptured membranes for now.  I will readdress the issue of delivery for nonreassuring fetal status for twin A once  the patient and her partner have spoken to the NICU.    All conversations were held with the patient today with the help of a Spanish interpreter.  Recommendations:   Inpatient management until delivery due to PPROM  Administer a complete course of antenatal corticosteroids, latency antibiotics, and magnesium for fetal neuro protection as needed   Intermittent fetal monitoring for twin A   Continuous monitoring for twin B  Goal for delivery is at 34 weeks    Delivery prior to 34 weeks would be indicated: for nonreassuring fetal status of twin B should she show any signs of an intrauterine infection should she go into spontaneous labor

## 2021-03-05 NOTE — H&P (Signed)
FACULTY PRACTICE ANTEPARTUM ADMISSION HISTORY AND PHYSICAL NOTE   History of Present Illness: Amy Prince is a 25 y.o. [phone] at [redacted]w[redacted]d admitted for PROM Tw A.  DI/DI twins. Twin A with hydrops. Breech/Breech  Patient reports the fetal movement as active. Patient reports uterine contraction  activity as irregular. Patient reports  vaginal bleeding as none. Patient describes fluid per vagina as Clear. Fetal presentation is breech/breech  Patient Active Problem List   Diagnosis Date Noted   Preterm premature rupture of membranes (PPROM)  03/05/2021   [redacted] weeks gestation of pregnancy 03/05/2021   Abnormal fetal ultrasound 02/09/2021   Increased antenatal AFP screen 01/12/2021   Supervision of high risk pregnancy, antepartum 01/09/2021   Antepartum bleeding, second trimester 12/12/2020   Twin pregnancy, antepartum 12/11/2020   History of pre-eclampsia in prior pregnancy, currently pregnant 12/01/2020   Obesity in pregnancy 11/23/2018   Language barrier 11/08/2018    Past Medical History:  Diagnosis Date   History of pre-eclampsia    Medical history non-contributory     Past Surgical History:  Procedure Laterality Date   CHOLECYSTECTOMY OPEN  2013    OB History  Gravida Para Term Preterm AB Living  4 2 1 1 1 2   SAB IAB Ectopic Multiple Live Births  1     0 2    # Outcome Date GA Lbr Len/2nd Weight Sex Delivery Anes PTL Lv  4 Current           3 Term 03/30/19 [redacted]w[redacted]d 02:50 / 00:16 3865 g M Vag-Spont None  LIV     Complications: Postpartum hemorrhage  2 SAB 2018 [redacted]w[redacted]d         1 Preterm 11/25/11 [redacted]w[redacted]d  2600 g M Vag-Spont None Y LIV     Birth Comments: induced at 36 weeks for preeclampsia     Complications: Preeclampsia    Social History   Socioeconomic History   Marital status: Married    Spouse name: Not on file   Number of children: Not on file   Years of education: Not on file   Highest education level: High school graduate  Occupational History   Not  on file  Tobacco Use   Smoking status: Never   Smokeless tobacco: Never  Vaping Use   Vaping Use: Never used  Substance and Sexual Activity   Alcohol use: Not Currently    Comment: Social   Drug use: Never    Comment: Social   Sexual activity: Not Currently    Birth control/protection: Pill  Other Topics Concern   Not on file  Social History Narrative   Not on file   Social Determinants of Health   Financial Resource Strain: Not on file  Food Insecurity: No Food Insecurity   Worried About Radiation protection practitioner of Food in the Last Year: Never true   Ran Out of Food in the Last Year: Never true  Transportation Needs: No Transportation Needs   Lack of Transportation (Medical): No   Lack of Transportation (Non-Medical): No  Physical Activity: Not on file  Stress: Not on file  Social Connections: Not on file    Family History  Problem Relation Age of Onset   Kidney disease Father     No Known Allergies  Medications Prior to Admission  Medication Sig Dispense Refill Last Dose   aspirin 81 MG chewable tablet Chew 81 mg by mouth daily.   03/04/2021   Prenatal Vit-Fe Fumarate-FA (PRENATAL MULTIVITAMIN) TABS tablet Take 1 tablet by mouth  daily at 12 noon. 30 tablet 11 03/04/2021    Review of Systems - negative  Vitals:  BP 116/66   Pulse (!) 106   Temp 98.1 F (36.7 C) (Oral)   Resp 18   LMP 08/19/2020 (Approximate)   SpO2 100%  Physical Examination: CONSTITUTIONAL: Well-developed, well-nourished female in no acute distress.  HENT:  Normocephalic, atraumatic, External right and left ear normal. Oropharynx is clear and moist EYES: Conjunctivae and EOM are normal. Pupils are equal, round, and reactive to light. No scleral icterus.  NECK: Normal range of motion, supple, no masses SKIN: Skin is warm and dry. No rash noted. Not diaphoretic. No erythema. No pallor. NEUROLGIC: Alert and oriented to person, place, and time. Normal reflexes, muscle tone coordination. No cranial nerve  deficit noted. PSYCHIATRIC: Normal mood and affect. Normal behavior. Normal judgment and thought content. CARDIOVASCULAR: Normal heart rate noted, regular rhythm RESPIRATORY: Effort and breath sounds normal, no problems with respiration noted ABDOMEN: Soft, nontender, nondistended, gravid. MUSCULOSKELETAL: Normal range of motion. No edema and no tenderness. 2+ distal pulses.  SVE FT/1 cm, thick, grossly ruptured Labs:  Results for orders placed or performed during the hospital encounter of 03/05/21 (from the past 24 hour(s))  Resp Panel by RT-PCR (Flu A&B, Covid) Nasopharyngeal Swab   Collection Time: 03/05/21  7:19 AM   Specimen: Nasopharyngeal Swab; Nasopharyngeal(NP) swabs in vial transport medium  Result Value Ref Range   SARS Coronavirus 2 by RT PCR NEGATIVE NEGATIVE   Influenza A by PCR NEGATIVE NEGATIVE   Influenza B by PCR NEGATIVE NEGATIVE  CBC with Differential/Platelet   Collection Time: 03/05/21  7:35 AM  Result Value Ref Range   WBC 8.0 4.0 - 10.5 K/uL   RBC 4.07 3.87 - 5.11 MIL/uL   Hemoglobin 12.4 12.0 - 15.0 g/dL   HCT 16.1 09.6 - 04.5 %   MCV 90.4 80.0 - 100.0 fL   MCH 30.5 26.0 - 34.0 pg   MCHC 33.7 30.0 - 36.0 g/dL   RDW 40.9 81.1 - 91.4 %   Platelets 250 150 - 400 K/uL   nRBC 0.0 0.0 - 0.2 %   Neutrophils Relative % 68 %   Neutro Abs 5.5 1.7 - 7.7 K/uL   Lymphocytes Relative 23 %   Lymphs Abs 1.8 0.7 - 4.0 K/uL   Monocytes Relative 7 %   Monocytes Absolute 0.6 0.1 - 1.0 K/uL   Eosinophils Relative 1 %   Eosinophils Absolute 0.1 0.0 - 0.5 K/uL   Basophils Relative 0 %   Basophils Absolute 0.0 0.0 - 0.1 K/uL   Immature Granulocytes 1 %   Abs Immature Granulocytes 0.07 0.00 - 0.07 K/uL  Comprehensive metabolic panel   Collection Time: 03/05/21  7:35 AM  Result Value Ref Range   Sodium 137 135 - 145 mmol/L   Potassium 4.0 3.5 - 5.1 mmol/L   Chloride 104 98 - 111 mmol/L   CO2 22 22 - 32 mmol/L   Glucose, Bld 92 70 - 99 mg/dL   BUN 6 6 - 20 mg/dL    Creatinine, Ser 7.82 0.44 - 1.00 mg/dL   Calcium 95.6 8.9 - 21.3 mg/dL   Total Protein 6.4 (L) 6.5 - 8.1 g/dL   Albumin 2.2 (L) 3.5 - 5.0 g/dL   AST 25 15 - 41 U/L   ALT 25 0 - 44 U/L   Alkaline Phosphatase 180 (H) 38 - 126 U/L   Total Bilirubin 0.6 0.3 - 1.2 mg/dL   GFR, Estimated >  60 >60 mL/min   Anion gap 11 5 - 15  Type and screen   Collection Time: 03/05/21  7:35 AM  Result Value Ref Range   ABO/RH(D) O POS    Antibody Screen POS    Sample Expiration      03/08/2021,2359 Performed at [facility], [address], [place], [place] [address]   Wet prep, genital   Collection Time: 03/05/21  9:08 AM   Specimen: Vaginal; Genital  Result Value Ref Range   Yeast Wet Prep HPF POC NONE SEEN NONE SEEN   Trich, Wet Prep NONE SEEN NONE SEEN   Clue Cells Wet Prep HPF POC NONE SEEN NONE SEEN   WBC, Wet Prep HPF POC MODERATE (A) NONE SEEN   Sperm NONE SEEN     Imaging Studies: [place] MFM MCA ADDL GEST RE EVAL  Result Date: 02/21/2021 ----------------------------------------------------------------------  OBSTETRICS REPORT                        (Signed Final 02/21/2021 06:23 am) ---------------------------------------------------------------------- Patient Info  ID #:       [id number]                          D.O.B.:  18-Feb-1996 (24 yrs)  Name:       Amy Prince                 Visit Date: 02/20/2021 04:30 pm              ESTRADA ---------------------------------------------------------------------- Performed By  Attending:        Ma Rings MD         Ref. Address:      [address]                                                              [place]                                                              [id number]  Performed By:     Hurman Horn          [facility]  Referred By:      Alberteen Spindle NP ---------------------------------------------------------------------- Orders  #  Description                           Code        Ordered By  1  [place] MFM OB LIMITED                     76815.01    YU FANG  2  [place] MFM MCA DOPPLER                    76821.01    YU FANG  3  [place] MFM MCA ADDL GEST RE               16109.6     YU FANG     EVAL ----------------------------------------------------------------------  #  Order #                     Accession #                Episode #  1  [id number]                   [id number]                 [id number]  2  [id number]                   [id number]                 [id number]  3  [id number]                   [id number]                 [id number] ---------------------------------------------------------------------- Indications  Maternal care for hydrops fetalis, second       O36.22X1  trimester, fetus 1  Elevated MSAFP                                  O28.9  [redacted] weeks gestation of pregnancy                 Z3A.24  Twin pregnancy, di/di, second trimester         O30.042  Obesity complicating pregnancy, second          O99.212  trimester (BMI 39)  Poor obstetric history: Previous                O09.299  preeclampsia / eclampsia/gestational HTN  Poor obstetric history (prior pe-term labor,    O09.219  postparum hemorrhage) ---------------------------------------------------------------------- Fetal Evaluation (Fetus A)  Num Of Fetuses:          2  Fetal Heart Rate(bpm):   148  Cardiac Activity:        Observed  Fetal Lie:  Maternal left side; lower fetus  Presentation:            Cephalic  Placenta:                Posterior  Membrane Desc:      Dividing Membrane seen  Amniotic Fluid  AFI FV:      Within normal limits                              Largest Pocket(cm)                              3.62  Comment:    Stomach, bladder,  and diaphragm seen. ---------------------------------------------------------------------- OB History  Gravidity:    4         Term:   2        Prem:   1        SAB:   1  TOP:          0       Ectopic:  0        Living: 2 ---------------------------------------------------------------------- Gestational Age (Fetus A)  LMP:           26w 3d        Date:  08/19/20                 EDD:   05/26/21  Best:          Darien Ramus 3d     Det. By:  U/S Fetus A              EDD:   06/09/21                                      (12/30/20) ---------------------------------------------------------------------- Doppler - Fetal Vessels (Fetus A)  Middle Cerebral Artery    S/D               RI                               PSV    MoM                                                     (cm/s)   6.11             0.81                              23.95   < 1 ---------------------------------------------------------------------- Fetal Evaluation (Fetus B)  Num Of Fetuses:          2  Fetal Heart Rate(bpm):   145  Cardiac Activity:        Observed  Fetal Lie:               Upper Fetus  Presentation:            Cephalic  Placenta:                Posterior  Membrane Desc:      Dividing Membrane  seen  Amniotic Fluid  AFI FV:      Within normal limits                              Largest Pocket(cm)                              2.58  Comment:    Bladder, stomach, and diaphragm seen. ---------------------------------------------------------------------- Gestational Age (Fetus B)  LMP:           26w 3d        Date:  08/19/20                 EDD:   05/26/21  Best:          Darien Ramus 3d     Det. By:  U/S Fetus A              EDD:   06/09/21                                      (12/30/20) ---------------------------------------------------------------------- Doppler - Fetal Vessels (Fetus B)  Middle Cerebral Artery    S/D                                PI    %tile     PSV    MoM                                                     (cm/s)   5.44                               1.31    < 2.5    35.54   1.15 ---------------------------------------------------------------------- Cervix Uterus Adnexa  Cervix  Length:           3.05  cm.  Normal appearance by transabdominal scan.  Uterus  No abnormality visualized.  Right Ovary  Not visualized.  Left Ovary  Not visualized.  Adnexa  No abnormality visualized. ---------------------------------------------------------------------- Comments  This patient was seen for a follow-up ultrasound exam as  fetal hydrops of twin A was noted during her prior exam in this  dichorionic, diamniotic twin gestation.  She denies any  problems since her last exam.  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  On today's exam, a heartbeat is still present in twin A.  Whole-  body edema (anasarca) and abdominal and thoracic ascites  conrinues to be noted in Twin A.  Doppler studies of the middle cerebral arteries performed in  twin A and Twin B did not indicate any signs of fetal anemia.  There were no signs of hydrops noted in twin B.  There was normal amniotic fluid noted around both fetuses.  The patient was advised that due to significant fetal hydrops  in twin A, the most likely outcome is an IUFD at some point in  her pregnancy.  We will follow up on the results of her toxoplasmosis IgG  avidity test.  Should the avidity test show low avidity,  indicating a recent infection, we will institute the appropriate  treatments for her.  The patient was advised that even if treatment is started for  toxoplasmosis, due to anasarca in twin A, the prognosis for  that fetus is still poor.  The patient was advised that we will continue to follow her  with weekly ultrasound exams for fetal assessment.  Should a fetal heartbeat be present in twin A in a few weeks,  we will consult with the NICU to determine if they can offer  Twin A treatment for hydrops after delivery.  She understands that if we were to proceed with  delivery for  the benefit of twin A, twin B (the normal-appearing fetus) may  be subjected to the effects of  prematurity.  We will only  consider an earlier delivery if the NICU can offer treatment for  twin A after delivery. It is the decision that she will have to  make at that time.  She will return in 1 week for another ultrasound exam.  All conversations were held with the patient today with the  help of a Spanish interpreter. ----------------------------------------------------------------------                   Ma Rings, MD Electronically Signed Final Report   02/21/2021 06:23 am ----------------------------------------------------------------------  [place] MFM MCA ADDL GEST  Result Date: 02/26/2021 ----------------------------------------------------------------------  OBSTETRICS REPORT                       (Signed Final 02/26/2021 03:00 pm) ---------------------------------------------------------------------- Patient Info  ID #:       [id number]                          D.O.B.:  [birth year] (24 yrs)  Name:       Amy Prince                 Visit Date: 02/26/2021 02:04 pm              ESTRADA ---------------------------------------------------------------------- Performed By  Attending:        Lin Landsman      Ref. Address:     [address]                    MD                                                             [place]                                                             [phone]  Performed By:  Sandi Mealy        [facility]  Referred By:      Alberteen Spindle NP ---------------------------------------------------------------------- Orders  #   Description                           Code        Ordered By  1  [place] MFM OB FOLLOW UP                   16109.60    YU FANG  2  [place] MFM OB FOLLOW UP ADDL              45409.81    YU FANG     GEST  3  [place] MFM MCA DOPPLER                    76821.01    YU FANG  4  [place] MFM MCA ADDL GEST                  19147.8     YU FANG ----------------------------------------------------------------------  #  Order #                     Accession #                Episode #  1  [id number]                   [id number]                 [id number]  2  [id number]                   [id number]                 [id number]  3  [id number]                   [id number]                 [id number]  4  [id number]                   [id number]  [id number] ---------------------------------------------------------------------- Indications  Maternal care for hydrops fetalis, second      O36.22X1  trimester, fetus 1  Elevated MSAFP                                 O28.9  Twin pregnancy, di/di, second trimester        O30.042  Obesity complicating pregnancy, second         O99.212  trimester (BMI 39)  Poor obstetric history: Previous               O09.299  preeclampsia / eclampsia/gestational HTN  Poor obstetric history (prior pe-term labor,   O09.219  postparum hemorrhage)  [redacted] weeks gestation of pregnancy                Z3A.25 ---------------------------------------------------------------------- Fetal Evaluation (Fetus A)  Num Of Fetuses:         2  Fetal Heart Rate(bpm):  152  Cardiac Activity:       Observed  Fetal Lie:              Lower Fetus; Mat Lt  Presentation:           Breech  Placenta:               Posterior  P. Cord Insertion:      Previously Visualized  Amniotic Fluid  AFI FV:      Within normal limits                              Largest Pocket(cm)                              3.03 ---------------------------------------------------------------------- Biometry (Fetus A)  BPD:      55.8  mm     G. Age:  23w 0d        < 1  %    CI:         79.94   %    70 - 86                                                          FL/HC:      21.7   %    18.7 - 20.3  HC:      197.2  mm     G. Age:  21w 6d        < 1  %    HC/AC:      0.89        1.04 - 1.22  AC:       221   mm     G. Age:  26w 4d         79  %    FL/BPD:     76.5   %    71 - 87  FL:       42.7  mm     G. Age:  24w 0d          7  %    FL/AC:      19.3   %    20 - 24  HUM:  40.4  mm     G. Age:  24w 4d         26  %  Est. FW:     753  gm    1 lb 11 oz      26  %     FW Discordancy        10  % ---------------------------------------------------------------------- OB History  Gravidity:    4         Term:   2        Prem:   1        SAB:   1  TOP:          0       Ectopic:  0        Living: 2 ---------------------------------------------------------------------- Gestational Age (Fetus A)  LMP:           27w 2d        Date:  08/19/20                 EDD:   05/26/21  U/S Today:     23w 6d                                        EDD:   06/19/21  Best:          25w 2d     Det. By:  U/S Fetus A              EDD:   06/09/21                                      (12/30/20) ---------------------------------------------------------------------- Anatomy (Fetus A)  Cranium:               Skin edema             Aortic Arch:            Not well visualized  Cavum:                 Previously seen        Ductal Arch:            Not well visualized  Ventricles:            Previously seen        Diaphragm:              Abnormal, see                                                                        comments  Choroid Plexus:        Previously seen        Stomach:                Appears normal, left  sided  Cerebellum:            Previously seen        Abdomen:                Ascites  Posterior Fossa:       Previously seen        Abdominal Wall:         Previously seen  Nuchal Fold:           Abnormal, see          Cord Vessels:           Not well  visualized                         comments  Face:                  Orbits and profile     Kidneys:                Not well visualized                         previously seen  Lips:                  Previously seen        Bladder:                Appears normal  Thoracic:              Pericardial            Spine:                  Not well visualized                         effusion; skin                         edema  Heart:                 Not well visualized    Upper Extremities:      Visualized  RVOT:                  Not well visualized    Lower Extremities:      Visualized  LVOT:                  Not well visualized  Other:  Nasal bone prev visualized. Fetus appears to be female. ---------------------------------------------------------------------- Doppler - Fetal Vessels (Fetus A)  Middle Cerebral Artery                                                       PSV   MoM                                                     (cm/s)  12.1  < 1 ---------------------------------------------------------------------- Fetal Evaluation (Fetus B)  Num Of Fetuses:         2  Fetal Heart Rate(bpm):  141  Cardiac Activity:       Observed  Fetal Lie:              Upper Fetus  Presentation:           Cephalic  Placenta:               Posterior  P. Cord Insertion:      Previously Visualized  Amniotic Fluid  AFI FV:      Within normal limits                              Largest Pocket(cm)                              5.4 ---------------------------------------------------------------------- Biometry (Fetus B)  BPD:      66.9  mm     G. Age:  27w 0d         90  %    CI:        75.85   %    70 - 86                                                          FL/HC:      17.0   %    18.7 - 20.3  HC:      243.5  mm     G. Age:  26w 3d         69  %    HC/AC:      1.08        1.04 - 1.22  AC:      224.6  mm     G. Age:  26w 6d         86  %    FL/BPD:     62.0   %    71 - 87  FL:        41.5  mm     G. Age:  23w 3d          3  %    FL/AC:      18.5   %    20 - 24  HUM:      41.1  mm     G. Age:  25w 0d         33  %  Est. FW:     835  gm    1 lb 13 oz      56  %     FW Discordancy     0 \ 10 % ---------------------------------------------------------------------- Gestational Age (Fetus B)  LMP:           27w 2d        Date:  08/19/20                 EDD:   05/26/21  U/S Today:     26w 0d  EDD:   06/04/21  Best:          Alcus Dad 2d     Det. By:  U/S Fetus A              EDD:   06/09/21                                      (12/30/20) ---------------------------------------------------------------------- Anatomy (Fetus B)  Cranium:               Previously seen        Aortic Arch:            Previously seen  Cavum:                 Previously seen        Ductal Arch:            Not well visualized  Ventricles:            Previously seen        Diaphragm:              Previously seen  Choroid Plexus:        Previously seen        Stomach:                Previously Seen  Cerebellum:            Previously seen        Abdomen:                Previously seen  Posterior Fossa:       Previously seen        Abdominal Wall:         Previously seen  Nuchal Fold:           Not applicable (>20    Cord Vessels:           Previously seen                         wks GA)  Face:                  Orbits and profile     Kidneys:                Not well visualized                         previously seen  Lips:                  Previously seen        Bladder:                Previously seen  Thoracic:              Appears normal         Spine:                  Not well visualized  Heart:                 Previously seen        Upper Extremities:      Previously seen  RVOT:                  Not well visualized    Lower Extremities:  Previously seen  LVOT:                  Previously seen  Other:  Heels prev visualized. ----------------------------------------------------------------------  Doppler - Fetal Vessels (Fetus B)  Middle Cerebral Artery                                                       PSV   MoM                                                     (cm/s)                                                      39.79  1.23 ---------------------------------------------------------------------- Impression  Follow up growth for dichorionic diamnoitic twin pregnancy  with known Anasarca in Twin A  Twin A- EFW 26th% which is over estimated due to ansarca  with ascites and pericardial effusion  Toxoplasmosis results returned from [facility]- I personally  spoke with the lab suggesting that 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 The IgM from [facility]  were negative- suggesting prior infection.  MCA Dopplers are < 1 MoM, however, the measurement was  suboptimal due to penetration and significant scalp edema. I  attempted to repeat the mesurement but was unsuccessful,  due to fetal position and edema.  Twin B normal stomach, amniotic fluid, bladder and an EFW  56 % -  Upper fetus, Cephalic  Normal MCA of 1.23 MoM.  Suboptimal views of the fetal anatomy was again observed  secondary to multiple gestation.  I discussed today's findings with Ms. Carolin Coy via interpreter.  We will discuss further with NICU via [facility] conference for  alternative approaches for a diagnosis.  She is scheduled to return on 9/16 for MCA's. ---------------------------------------------------------------------- Recommendations  Limited exam scheduled on 9/16. ----------------------------------------------------------------------               Lin Landsman, MD Electronically Signed Final Report   02/26/2021 03:00 pm ----------------------------------------------------------------------  [place] MFM MCA ADDL GEST  Result Date: 02/14/2021 ----------------------------------------------------------------------  OBSTETRICS REPORT                        (Signed Final 02/14/2021 07:14 am)  ---------------------------------------------------------------------- Patient Info  ID #:       [id number]                          D.O.B.:  [birth year] (24 yrs)  Name:       Amy Prince                 Visit Date: 02/13/2021 03:38 pm              ESTRADA ---------------------------------------------------------------------- Performed By  Attending:        Ma Rings MD         Ref. Address:      [address]  [place]                                                              [id number]  Performed By:     Fayne Norrie BS,      [facility]                    RD[facility], RVT                                 [facility]  Referred By:      Alberteen Spindle NP ---------------------------------------------------------------------- Orders  #  Description                           Code        Ordered By  1  [place] MFM MCA DOPPLER                    76821.01    YU FANG  2  [place] MFM MCA ADDL GEST                  60454.0     YU FANG  3  [place] MFM OB LIMITED                     [id number]    YU FANG ----------------------------------------------------------------------  #  Order #                     Accession #                Episode #  1  [id number]                   [id number]                 [id number]  2  [id number]  [id number]                 [id number]  3  [id number]                   [id number]                 [id number] ---------------------------------------------------------------------- Indications  Maternal care for hydrops fetalis, second       O36.22X1  trimester, fetus 1  Twin pregnancy, di/di, second trimester         O30.042  Elevated MSAFP                                  O28.9  Obesity  complicating pregnancy, second          O99.212  trimester (BMI 39)  Poor obstetric history: Previous                O09.299  preeclampsia / eclampsia/gestational HTN  Poor obstetric history (prior pe-term labor,    O09.219  postparum hemorrhage)  [redacted] weeks gestation of pregnancy                 Z3A.23 ---------------------------------------------------------------------- Fetal Evaluation (Fetus A)  Num Of Fetuses:          2  Fetal Heart Rate(bpm):   150  Cardiac Activity:        Observed  Fetal Lie:               Lower Fetus; Maternal Left  Presentation:            Breech  Placenta:                Posterior  P. Cord Insertion:       Not well visualized  Membrane Desc:      Dividing Membrane seen - Dichorionic.  Amniotic Fluid  AFI FV:      Within normal limits                              Largest Pocket(cm)                              8.71 ---------------------------------------------------------------------- Biometry (Fetus A)  LV:        8.7  mm ---------------------------------------------------------------------- OB History  Gravidity:    4         Term:   2        Prem:   1        SAB:   1  TOP:          0       Ectopic:  0        Living: 2 ---------------------------------------------------------------------- Gestational Age (Fetus A)  LMP:           25w 3d        Date:  08/19/20                 EDD:   05/26/21  Best:          23w 3d     Det. By:  U/S Fetus A              EDD:   06/09/21                                      (  12/30/20) ---------------------------------------------------------------------- Anatomy (Fetus A)  Cranium:               Skin edema             Abdomen:                Ascites  Ventricles:            Upper limits of        Abdominal Wall:         Abnormal, see                         normal                                                                        comments  Thoracic:              Pleural effusion;l     Kidneys:                Appear normal                         skin  edema  Heart:                 Appears normal         Bladder:                Appears normal                         (4CH, axis, and                         situs)  Diaphragm:             Appears normal ---------------------------------------------------------------------- Doppler - Fetal Vessels (Fetus A)  Middle Cerebral Artery                      RI               PI    %tile     PSV    MoM                                                     (cm/s)                    0.76              1.7       37    25.96   < 1 ---------------------------------------------------------------------- Fetal Evaluation (Fetus B)  Num Of Fetuses:          2  Fetal Heart Rate(bpm):   158  Cardiac Activity:        Observed  Fetal Lie:               Upper Fetus  Presentation:            Cephalic  Placenta:  Fundal  P. Cord Insertion:       Previously Visualized  Membrane Desc:      Dividing Membrane seen - Dichorionic.  Amniotic Fluid  AFI FV:      Within normal limits                              Largest Pocket(cm)                              4.75 ---------------------------------------------------------------------- Biometry (Fetus B)  LV:        5.1  mm ---------------------------------------------------------------------- Gestational Age (Fetus B)  LMP:           25w 3d        Date:  08/19/20                 EDD:   05/26/21  Best:          23w 3d     Det. By:  U/S Fetus A              EDD:   06/09/21                                      (12/30/20) ---------------------------------------------------------------------- Anatomy (Fetus B)  Ventricles:            Appears normal         Stomach:                Appears normal, left                                                                        sided  Thoracic:              Appears normal         Kidneys:                Appear normal  Heart:                 Appears normal         Bladder:                Appears normal                         (4CH, axis, and                          situs) ---------------------------------------------------------------------- Doppler - Fetal Vessels (Fetus B)  Middle Cerebral Artery                      RI               PI    %tile     PSV    MoM                                                     (  cm/s)                    0.93             2.32       91    35.46   1.2 ---------------------------------------------------------------------- Comments  This patient was seen for a follow-up ultrasound exam as  fetal hydrops of twin A was noted during her last exam in this  dichorionic, diamniotic twin gestation.  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  I  made a call to [facility] and had them add on the confirmatory  toxoplasmosis IgG avidity test.  The results are currently  pending.  She denies any problems since her last exam.  On today's exam, a heartbeat is still present in twin A.  Twin A  now has whole-body edema (anasarca) and abdominal and  thoracic ascites which is worse than her exam last week.  Doppler studies of the middle cerebral arteries performed in  twin A did not indicate any signs of fetal anemia.  The patient was advised that due to significant fetal hydrops  in twin A, the most likely outcome is an IUFD at some point in  her pregnancy.  She was reassured that as this is a dichorionic twin gestation,  the demise of twin A will probably not have any effects on  twin B.  She was advised that should she have any acute  toxoplasmosis infection, twin B was also be at risk for the  effects of the infection.  There were no signs of hydrops in twin B.  There was normal  amniotic fluid noted around both fetuses.  We will follow up on the results of her toxoplasmosis IgG  avidity test.  Should the avidity test show low avidity,  indicating a recent infection, we will institute the appropriate  treatments for her.  The patient was advised that even if treatment is started  for  toxoplasmosis, due to anasarca in twin A, the prognosis for  that fetus is still poor.  We have looked into the possibility of selective reduction of  twin A.  However, she is too far along in her pregnancy for  selective reduction.   She will return in 1 week for another ultrasound exam.   All conversations were held with the patient today with the  help of a Spanish interpreter. ----------------------------------------------------------------------                   Ma Rings, MD Electronically Signed Final Report   02/14/2021 07:14 am ----------------------------------------------------------------------  [place] MFM MCA DOPPLER  Result Date: 02/26/2021 ----------------------------------------------------------------------  OBSTETRICS REPORT                       (Signed Final 02/26/2021 03:00 pm) ---------------------------------------------------------------------- Patient Info  ID #:       [id number]                          D.O.B.:  1996-02-27 (24 yrs)  Name:       Amy Prince                 Visit Date: 02/26/2021 02:04 pm              ESTRADA ---------------------------------------------------------------------- Performed By  Attending:        Lin Landsman      Ref. Address:  [address]                    [place]                                                             [place]                                                             [id number]  Performed By:     Sandi Mealy        Location[facility]                    RDMS                                     [facility]  Referred By:      Alberteen Spindle NP ---------------------------------------------------------------------- Orders  #  Description                           Code         Ordered By  1  [place] MFM OB FOLLOW UP                   76816.01    YU FANG  2  [place] MFM OB FOLLOW UP ADDL              60454.09    YU FANG     GEST  3  [place] MFM MCA DOPPLER                    76821.01    YU FANG  4  [place] MFM MCA ADDL GEST  [address].6     Rosana Hoes ----------------------------------------------------------------------  #  Order #                     Accession #                Episode #  1  [id number]                   [id number]                 [id number]  2  [id number]                   [id number]                 [id number]  3  [id number]                   [id number]                 [id number]  4  [id number]                   [id number]                 [id number] ---------------------------------------------------------------------- Indications  Maternal care for hydrops fetalis, second      O36.22X1  trimester, fetus 1  Elevated MSAFP                                 O28.9  Twin pregnancy, di/di, second trimester        O30.042  Obesity complicating pregnancy, second         O99.212  trimester (BMI 39)  Poor obstetric history: Previous               O09.299  preeclampsia / eclampsia/gestational HTN  Poor obstetric history (prior pe-term labor,   O09.219  postparum hemorrhage)  [redacted] weeks gestation of pregnancy                Z3A.25 ---------------------------------------------------------------------- Fetal Evaluation (Fetus A)  Num Of Fetuses:         2  Fetal Heart Rate(bpm):  152  Cardiac Activity:       Observed  Fetal Lie:              Lower Fetus; Mat Lt  Presentation:           Breech  Placenta:               Posterior  P. Cord Insertion:      Previously Visualized  Amniotic Fluid  AFI FV:      Within normal limits                              Largest Pocket(cm)                              3.03 ---------------------------------------------------------------------- Biometry (Fetus A)  BPD:      55.8  mm     G. Age:  23w 0d        < 1  %    CI:        79.94   %    70 - 86  FL/HC:      21.7   %    18.7 - 20.3  HC:      197.2  mm     G. Age:  21w 6d        < 1  %    HC/AC:      0.89        1.04 - 1.22  AC:       221   mm     G. Age:  26w 4d         79  %    FL/BPD:     76.5   %    71 - 87  FL:       42.7  mm     G. Age:  24w 0d          7  %    FL/AC:      19.3   %    20 - 24  HUM:      40.4  mm     G. Age:  24w 4d         26  %  Est. FW:     753  gm    1 lb 11 oz      26  %     FW Discordancy        10  % ---------------------------------------------------------------------- OB History  Gravidity:    4         Term:   2        Prem:   1        SAB:   1  TOP:          0       Ectopic:  0        Living: 2 ---------------------------------------------------------------------- Gestational Age (Fetus A)  LMP:           27w 2d        Date:  08/19/20                 EDD:   05/26/21  U/S Today:     23w 6d                                        EDD:   06/19/21  Best:          25w 2d     Det. By:  U/S Fetus A              EDD:   06/09/21                                      (12/30/20) ---------------------------------------------------------------------- Anatomy (Fetus A)  Cranium:               Skin edema             Aortic Arch:            Not well visualized  Cavum:                 Previously seen        Ductal Arch:            Not well visualized  Ventricles:            Previously seen  Diaphragm:              Abnormal, see                                                                        comments  Choroid Plexus:        Previously seen        Stomach:                Appears normal, left                                                                        sided  Cerebellum:            Previously seen        Abdomen:                Ascites  Posterior Fossa:       Previously seen        Abdominal Wall:         Previously seen  Nuchal Fold:           Abnormal, see          Cord Vessels:           Not well visualized                         comments   Face:                  Orbits and profile     Kidneys:                Not well visualized                         previously seen  Lips:                  Previously seen        Bladder:                Appears normal  Thoracic:              Pericardial            Spine:                  Not well visualized                         effusion; skin                         edema  Heart:                 Not well visualized    Upper Extremities:      Visualized  RVOT:                  Not  well visualized    Lower Extremities:      Visualized  LVOT:                  Not well visualized  Other:  Nasal bone prev visualized. Fetus appears to be female. ---------------------------------------------------------------------- Doppler - Fetal Vessels (Fetus A)  Middle Cerebral Artery                                                       PSV   MoM                                                     (cm/s)                                                       12.1  < 1 ---------------------------------------------------------------------- Fetal Evaluation (Fetus B)  Num Of Fetuses:         2  Fetal Heart Rate(bpm):  141  Cardiac Activity:       Observed  Fetal Lie:              Upper Fetus  Presentation:           Cephalic  Placenta:               Posterior  P. Cord Insertion:      Previously Visualized  Amniotic Fluid  AFI FV:      Within normal limits                              Largest Pocket(cm)                              5.4 ---------------------------------------------------------------------- Biometry (Fetus B)  BPD:      66.9  mm     G. Age:  27w 0d         90  %    CI:        75.85   %    70 - 86                                                          FL/HC:      17.0   %    18.7 - 20.3  HC:      243.5  mm     G. Age:  26w 3d         69  %    HC/AC:      1.08        1.04 - 1.22  AC:      224.6  mm     G. Age:  26w 6d  86  %    FL/BPD:     62.0   %    71 - 87  FL:       41.5  mm     G. Age:  23w 3d          3  %     FL/AC:      18.5   %    20 - 24  HUM:      41.1  mm     G. Age:  25w 0d         33  %  Est. FW:     835  gm    1 lb 13 oz      56  %     FW Discordancy     0 \ 10 % ---------------------------------------------------------------------- Gestational Age (Fetus B)  LMP:           27w 2d        Date:  08/19/20                 EDD:   05/26/21  U/S Today:     26w 0d                                        EDD:   06/04/21  Best:          25w 2d     Det. By:  U/S Fetus A              EDD:   06/09/21                                      (12/30/20) ---------------------------------------------------------------------- Anatomy (Fetus B)  Cranium:               Previously seen        Aortic Arch:            Previously seen  Cavum:                 Previously seen        Ductal Arch:            Not well visualized  Ventricles:            Previously seen        Diaphragm:              Previously seen  Choroid Plexus:        Previously seen        Stomach:                Previously Seen  Cerebellum:            Previously seen        Abdomen:                Previously seen  Posterior Fossa:       Previously seen        Abdominal Wall:         Previously seen  Nuchal Fold:           Not applicable (>20    Cord Vessels:           Previously seen  wks GA)  Face:                  Orbits and profile     Kidneys:                Not well visualized                         previously seen  Lips:                  Previously seen        Bladder:                Previously seen  Thoracic:              Appears normal         Spine:                  Not well visualized  Heart:                 Previously seen        Upper Extremities:      Previously seen  RVOT:                  Not well visualized    Lower Extremities:      Previously seen  LVOT:                  Previously seen  Other:  Heels prev visualized. ---------------------------------------------------------------------- Doppler - Fetal Vessels (Fetus B)  Middle  Cerebral Artery                                                       PSV   MoM                                                     (cm/s)                                                      39.79  1.23 ---------------------------------------------------------------------- Impression  Follow up growth for dichorionic diamnoitic twin pregnancy  with known Anasarca in Twin A  Twin A- EFW 26th% which is over estimated due to ansarca  with ascites and pericardial effusion  Toxoplasmosis results returned from [facility]- I personally  spoke with the lab suggesting that 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 The IgM from [facility]  were negative- suggesting prior infection.  MCA Dopplers are < 1 MoM, however, the measurement was  suboptimal due to penetration and significant scalp edema. I  attempted to repeat the mesurement but was unsuccessful,  due to fetal position and edema.  Twin B normal stomach, amniotic fluid, bladder and an EFW  56 % -  Upper fetus, Cephalic  Normal MCA of 1.23 MoM.  Suboptimal views of the fetal anatomy was again observed  secondary to multiple gestation.  I discussed today's findings with Ms. Carolin Coy via  interpreter.  We will discuss further with NICU via [facility] conference for  alternative approaches for a diagnosis.  She is scheduled to return on 9/16 for MCA's. ---------------------------------------------------------------------- Recommendations  Limited exam scheduled on 9/16. ----------------------------------------------------------------------               Lin Landsman, MD Electronically Signed Final Report   02/26/2021 03:00 pm ----------------------------------------------------------------------  [place] MFM MCA DOPPLER  Result Date: 02/21/2021 ----------------------------------------------------------------------  OBSTETRICS REPORT                        (Signed Final 02/21/2021 06:23 am) ----------------------------------------------------------------------  Patient Info  ID #:       [id number]                          D.O.B.:  07-Mar-1996 (24 yrs)  Name:       Amy Prince                 Visit Date: 02/20/2021 04:30 pm              ESTRADA ---------------------------------------------------------------------- Performed By  Attending:        Ma Rings MD         Ref. Address:      [address]                                                              [place]                                                              [id number]  Performed By:     Hurman Horn          [facility]                    RDMS                                      [facility]  Referred By:      Alberteen Spindle NP ---------------------------------------------------------------------- Orders  #  Description                           Code        Ordered By  1  [place] MFM OB LIMITED                     76815.01    YU FANG  2  [place] MFM MCA DOPPLER                    76821.01    YU FANG  3  [place] MFM MCA ADDL GEST RE               10175.1     YU FANG     EVAL ----------------------------------------------------------------------  #  Order #                     Accession #                Episode #  1  [id number]                   [id number]                 [id number]  2  [id number]                   [id number]                 [id number]  3  [id number]                   [id number]                 [id number] ---------------------------------------------------------------------- Indications  Maternal care for hydrops fetalis, second       O36.22X1  trimester, fetus 1  Elevated MSAFP                                  O28.9  [redacted] weeks gestation of pregnancy                 Z3A.24  Twin pregnancy, di/di, second trimester         O30.042  Obesity complicating  pregnancy, second          O99.212  trimester (BMI 39)  Poor obstetric history: Previous                O09.299  preeclampsia / eclampsia/gestational HTN  Poor obstetric history (prior pe-term labor,    O09.219  postparum hemorrhage) ---------------------------------------------------------------------- Fetal Evaluation (Fetus A)  Num Of Fetuses:          2  Fetal Heart Rate(bpm):   148  Cardiac Activity:        Observed  Fetal Lie:               Maternal left side; lower fetus  Presentation:            Cephalic  Placenta:                Posterior  Membrane Desc:      Dividing Membrane seen  Amniotic Fluid  AFI FV:      Within normal limits                              Largest Pocket(cm)                              3.62  Comment:    Stomach, bladder, and diaphragm seen. ---------------------------------------------------------------------- OB History  Gravidity:    4         Term:   2        Prem:   1        SAB:   1  TOP:          0       Ectopic:  0        Living: 2 ---------------------------------------------------------------------- Gestational Age (Fetus A)  LMP:           26w 3d        Date:  08/19/20                 EDD:   05/26/21  Best:          Darien Ramus 3d     Det. By:  U/S Fetus A              EDD:   06/09/21                                      (12/30/20) ---------------------------------------------------------------------- Doppler - Fetal Vessels (Fetus A)  Middle Cerebral Artery    S/D               RI                               PSV    MoM                                                     (cm/s)   6.11             0.81                              23.95   < 1 ---------------------------------------------------------------------- Fetal Evaluation (Fetus B)  Num Of Fetuses:          2  Fetal Heart Rate(bpm):   145  Cardiac Activity:        Observed  Fetal Lie:               Upper Fetus  Presentation:            Cephalic  Placenta:                Posterior  Membrane Desc:      Dividing Membrane seen   Amniotic Fluid  AFI FV:      Within normal limits                              Largest Pocket(cm)  2.58  Comment:    Bladder, stomach, and diaphragm seen. ---------------------------------------------------------------------- Gestational Age (Fetus B)  LMP:           26w 3d        Date:  08/19/20                 EDD:   05/26/21  Best:          Darien Ramus 3d     Det. By:  U/S Fetus A              EDD:   06/09/21                                      (12/30/20) ---------------------------------------------------------------------- Doppler - Fetal Vessels (Fetus B)  Middle Cerebral Artery    S/D                                PI    %tile     PSV    MoM                                                     (cm/s)   5.44                              1.31    < 2.5    35.54   1.15 ---------------------------------------------------------------------- Cervix Uterus Adnexa  Cervix  Length:           3.05  cm.  Normal appearance by transabdominal scan.  Uterus  No abnormality visualized.  Right Ovary  Not visualized.  Left Ovary  Not visualized.  Adnexa  No abnormality visualized. ---------------------------------------------------------------------- Comments  This patient was seen for a follow-up ultrasound exam as  fetal hydrops of twin A was noted during her prior exam in this  dichorionic, diamniotic twin gestation.  She denies any  problems since her last exam.  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  On today's exam, a heartbeat is still present in twin A.  Whole-  body edema (anasarca) and abdominal and thoracic ascites  conrinues to be noted in Twin A.  Doppler studies of the middle cerebral arteries performed in  twin A and Twin B did not indicate any signs of fetal anemia.  There were no signs of hydrops noted in twin B.  There was normal amniotic fluid noted around both fetuses.  The patient was advised that due to significant fetal hydrops  in twin  A, the most likely outcome is an IUFD at some point in  her pregnancy.  We will follow up on the results of her toxoplasmosis IgG  avidity test.  Should the avidity test show low avidity,  indicating a recent infection, we will institute the appropriate  treatments for her.  The patient was advised that even if treatment is started for  toxoplasmosis, due to anasarca in twin A, the prognosis for  that fetus is still poor.  The patient was advised that we will continue to follow her  with weekly ultrasound exams for fetal assessment.  Should a fetal heartbeat  be present in twin A in a few weeks,  we will consult with the NICU to determine if they can offer  Twin A treatment for hydrops after delivery.  She understands that if we were to proceed with delivery for  the benefit of twin A, twin B (the normal-appearing fetus) may  be subjected to the effects of  prematurity.  We will only  consider an earlier delivery if the NICU can offer treatment for  twin A after delivery. It is the decision that she will have to  make at that time.  She will return in 1 week for another ultrasound exam.  All conversations were held with the patient today with the  help of a Spanish interpreter. ----------------------------------------------------------------------                   Ma Rings, MD Electronically Signed Final Report   02/21/2021 06:23 am ----------------------------------------------------------------------  [place] MFM MCA DOPPLER  Result Date: 02/14/2021 ----------------------------------------------------------------------  OBSTETRICS REPORT                        (Signed Final 02/14/2021 07:14 am) ---------------------------------------------------------------------- Patient Info  ID #:       [id number]                          D.O.B.:  [birth year] (24 yrs)  Name:       Amy Prince                 Visit Date: 02/13/2021 03:38 pm              ESTRADA  ---------------------------------------------------------------------- Performed By  Attending:        Ma Rings MD         Ref. Address:      [address]                                                              [place]                                                              [address]  Performed By:     Fayne Norrie BS,      [facility], RVT                                 [facility]  Referred By:      Alberteen Spindle NP ---------------------------------------------------------------------- Orders  #  Description                           Code        Ordered By  1  [place] MFM MCA DOPPLER                    [phone]    YU FANG  2  [place] MFM MCA ADDL GEST                  45409.8     YU FANG  3  [place] MFM OB LIMITED                     [id number]    YU FANG ----------------------------------------------------------------------  #  Order #                     Accession #                Episode #  1  [id number]                   [id number]                 [id number]  2  [id number]                   [id number]                 [id number]  3  [id number]                   [id number]                 [id number] ---------------------------------------------------------------------- Indications  Maternal care for hydrops fetalis, second       O36.22X1  trimester, fetus 1  Twin pregnancy, di/di, second trimester         O30.042  Elevated MSAFP                                  O28.9  Obesity complicating pregnancy, second          O99.212  trimester (BMI 39)  Poor obstetric history: Previous                O09.299  preeclampsia / eclampsia/gestational HTN  Poor obstetric history (prior pe-term labor,    O09.219  postparum hemorrhage)  [redacted] weeks gestation  of pregnancy                 Z3A.23 ---------------------------------------------------------------------- Fetal Evaluation (Fetus A)  Num Of Fetuses:          2  Fetal Heart Rate(bpm):   150  Cardiac Activity:        Observed  Fetal Lie:  Lower Fetus; Maternal Left  Presentation:            Breech  Placenta:                Posterior  P. Cord Insertion:       Not well visualized  Membrane Desc:      Dividing Membrane seen - Dichorionic.  Amniotic Fluid  AFI FV:      Within normal limits                              Largest Pocket(cm)                              8.71 ---------------------------------------------------------------------- Biometry (Fetus A)  LV:        8.7  mm ---------------------------------------------------------------------- OB History  Gravidity:    4         Term:   2        Prem:   1        SAB:   1  TOP:          0       Ectopic:  0        Living: 2 ---------------------------------------------------------------------- Gestational Age (Fetus A)  LMP:           25w 3d        Date:  08/19/20                 EDD:   05/26/21  Best:          23w 3d     Det. By:  U/S Fetus A              EDD:   06/09/21                                      (12/30/20) ---------------------------------------------------------------------- Anatomy (Fetus A)  Cranium:               Skin edema             Abdomen:                Ascites  Ventricles:            Upper limits of        Abdominal Wall:         Abnormal, see                         normal                                                                        comments  Thoracic:              Pleural effusion;l     Kidneys:                Appear normal                         skin  edema  Heart:                 Appears normal         Bladder:                Appears normal                         (4CH, axis, and                         situs)  Diaphragm:             Appears normal  ---------------------------------------------------------------------- Doppler - Fetal Vessels (Fetus A)  Middle Cerebral Artery                      RI               PI    %tile     PSV    MoM                                                     (cm/s)                    0.76              1.7       37    25.96   < 1 ---------------------------------------------------------------------- Fetal Evaluation (Fetus B)  Num Of Fetuses:          2  Fetal Heart Rate(bpm):   158  Cardiac Activity:        Observed  Fetal Lie:               Upper Fetus  Presentation:            Cephalic  Placenta:                Fundal  P. Cord Insertion:       Previously Visualized  Membrane Desc:      Dividing Membrane seen - Dichorionic.  Amniotic Fluid  AFI FV:      Within normal limits                              Largest Pocket(cm)                              4.75 ---------------------------------------------------------------------- Biometry (Fetus B)  LV:        5.1  mm ---------------------------------------------------------------------- Gestational Age (Fetus B)  LMP:           25w 3d        Date:  08/19/20                 EDD:   05/26/21  Best:          23w 3d     Det. By:  U/S Fetus A              EDD:   06/09/21                                      (  12/30/20) ---------------------------------------------------------------------- Anatomy (Fetus B)  Ventricles:            Appears normal         Stomach:                Appears normal, left                                                                        sided  Thoracic:              Appears normal         Kidneys:                Appear normal  Heart:                 Appears normal         Bladder:                Appears normal                         (4CH, axis, and                         situs) ---------------------------------------------------------------------- Doppler - Fetal Vessels (Fetus B)  Middle Cerebral Artery                      RI               PI    %tile      PSV    MoM                                                     (cm/s)                    0.93             2.32       91    35.46   1.2 ---------------------------------------------------------------------- Comments  This patient was seen for a follow-up ultrasound exam as  fetal hydrops of twin A was noted during her last exam in this  dichorionic, diamniotic twin gestation.  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  I  made a call to [facility] and had them add on the confirmatory  toxoplasmosis IgG avidity test.  The results are currently  pending.  She denies any problems since her last exam.  On today's exam, a heartbeat is still present in twin A.  Twin A  now has whole-body edema (anasarca) and abdominal and  thoracic ascites which is worse than her exam last week.  Doppler studies of the middle cerebral arteries performed in  twin A did not indicate any signs of fetal anemia.  The patient was advised that due to significant fetal hydrops  in twin A, the most likely outcome is an IUFD at some point in  her pregnancy.  She was reassured that as this is  a dichorionic twin gestation,  the demise of twin A will probably not have any effects on  twin B.  She was advised that should she have any acute  toxoplasmosis infection, twin B was also be at risk for the  effects of the infection.  There were no signs of hydrops in twin B.  There was normal  amniotic fluid noted around both fetuses.  We will follow up on the results of her toxoplasmosis IgG  avidity test.  Should the avidity test show low avidity,  indicating a recent infection, we will institute the appropriate  treatments for her.  The patient was advised that even if treatment is started for  toxoplasmosis, due to anasarca in twin A, the prognosis for  that fetus is still poor.  We have looked into the possibility of selective reduction of  twin A.  However, she is too far along in her  pregnancy for  selective reduction.   She will return in 1 week for another ultrasound exam.   All conversations were held with the patient today with the  help of a Spanish interpreter. ----------------------------------------------------------------------                   Ma Rings, MD Electronically Signed Final Report   02/14/2021 07:14 am ----------------------------------------------------------------------  [place] MFM MCA DOPPLER  Result Date: 02/10/2021 ----------------------------------------------------------------------  OBSTETRICS REPORT                    (Corrected Final 02/10/2021 09:17 am) ---------------------------------------------------------------------- Patient Info  ID #:       [id number]                          D.O.B.:  01-27-1996 (24 yrs)  Name:       Amy Prince                 Visit Date: 02/06/2021 01:10 pm              ESTRADA ---------------------------------------------------------------------- Performed By  Attending:        Ma Rings MD         Ref. Address:     [address]                                                             [place]                                                             [address]  Performed By:     Reinaldo Raddle            Location:         [facility]  RD[facility]                                     [facility]  Referred By:      Alberteen Spindle NP ---------------------------------------------------------------------- Orders  #  Description                           Code        Ordered By  1  [place] MFM OB DETAIL +14 WK               40981.19    Lin Landsman  2  [place] MFM OB DETAIL ADDL GEST            76811.02    CORENTHIAN     +14 WK                                             BOOKER  3  [place] MFM MCA DOPPLER                    14782.95    Lin Landsman ----------------------------------------------------------------------  #  Order #                     Accession #                Episode #  1  [id number]                   [id number]                 [id number]  2  [id number]                   [id number]  [id number]  3  [id number]                   [id number]                 [id number] ---------------------------------------------------------------------- Indications  Twin pregnancy, di/di, second trimester        O30.042  Elevated MSAFP                                 O28.9  Encounter for antenatal screening for          Z36.3  malformations  Obesity complicating pregnancy, second         O99.212  trimester (BMI 39)  Poor obstetric history: Previous               O09.299  preeclampsia / eclampsia/gestational HTN  Poor obstetric history (prior pe-term labor,   O09.219  postparum hemorrhage)  [redacted] weeks gestation of pregnancy                Z3A.22 ---------------------------------------------------------------------- Fetal Evaluation (Fetus A)  Num Of Fetuses:         2  Fetal Heart Rate(bpm):  137  Cardiac Activity:       Observed  Fetal Lie:              Maternal left side  Presentation:           Transverse, head to maternal right  Placenta:               Posterior  P. Cord Insertion:      Not well visualized  Membrane Desc:      Dividing Membrane seen  Amniotic Fluid  AFI FV:      Within normal limits                              Largest Pocket(cm)                              5.1 ---------------------------------------------------------------------- Biometry (Fetus A)  BPD:        55  mm     G. Age:  22w 5d         60  %    CI:        76.72   %    70 - 86                                                          FL/HC:      15.7   %    18.4 - 20.2  HC:      198.9  mm     G. Age:  22w 0d         23  %     HC/AC:      0.85        1.06 - 1.25  AC:      233.6  mm     G. Age:  27w 5d       > 99  %    FL/BPD:     56.7   %    71 -  87  FL:       31.2  mm     G. Age:  19w 5d        < 1  %    FL/AC:      13.4   %    20 - 24  HUM:      31.2  mm     G. Age:  20w 3d        < 5  %  CER:      26.7  mm     G. Age:  24w 0d     > 97.7  %  NFT:      11.6  mm  LV:        6.7  mm  CM:        2.8  mm  Est. FW:     657  gm      1 lb 7 oz     99  %     FW Discordancy     0 \ 17 % ---------------------------------------------------------------------- OB History  Gravidity:    4         Term:   2        Prem:   1        SAB:   1  TOP:          0       Ectopic:  0        Living: 2 ---------------------------------------------------------------------- Gestational Age (Fetus A)  LMP:           24w 3d        Date:  08/19/20                 EDD:   05/26/21  U/S Today:     23w 0d                                        EDD:   06/05/21  Best:          22w 3d     Det. By:  U/S Fetus A              EDD:   06/09/21                                      (12/30/20) ---------------------------------------------------------------------- Anatomy (Fetus A)  Cranium:               Appears normal         Aortic Arch:            Not well visualized  Cavum:                 Not well visualized    Ductal Arch:            Not well visualized  Ventricles:            Appears normal         Diaphragm:              Appears normal  Choroid Plexus:        Appears normal         Stomach:                Appears normal, left  sided  Cerebellum:            Appears normal         Abdomen:                Abnormal, see                                                                        comments  Posterior Fossa:       Appears normal         Abdominal Wall:         Appears nml (cord                                                                        insert, abd wall)  Nuchal Fold:           Abnormal,  see          Cord Vessels:           Not well visualized                         comments  Face:                  Appears normal         Kidneys:                Not well visualized                         (orbits and profile)  Lips:                  Appears normal         Bladder:                Not well visualized  Thoracic:              Abnormal, see          Spine:                  Not well visualized                         comments  Heart:                 Not well visualized    Upper Extremities:      Visualized  RVOT:                  Not well visualized    Lower Extremities:      Visualized  LVOT:                  Not well visualized  Other:  Nasal bone visualized. Fetus appears to be female. ---------------------------------------------------------------------- Doppler - Fetal Vessels (Fetus A)  Middle Cerebral Artery   S/D  RI                               PSV   MoM                                                     (cm/s)    4.5             0.78                               25.5  < 1 ---------------------------------------------------------------------- Fetal Evaluation (Fetus B)  Num Of Fetuses:         2  Fetal Heart Rate(bpm):  157  Cardiac Activity:       Observed  Fetal Lie:              Maternal right side  Presentation:           Variable  Placenta:               Fundal  P. Cord Insertion:      Visualized, central  Amniotic Fluid  AFI FV:      Within normal limits                              Largest Pocket(cm)                              4.3 ---------------------------------------------------------------------- Biometry (Fetus B)  BPD:      58.3  mm     G. Age:  23w 6d         91  %    CI:        81.46   %    70 - 86                                                          FL/HC:      18.4   %    18.4 - 20.2  HC:      203.9  mm     G. Age:  22w 4d         40  %    HC/AC:      1.08        1.06 - 1.25  AC:      188.6  mm     G. Age:  23w 4d         79  %    FL/BPD:     64.3   %    71  - 87  FL:       37.5  mm     G. Age:  22w 0d         25  %    FL/AC:      19.9   %    20 - 24  CER:        24  mm  G. Age:  22w 1d         63  %  NFT:       6.0  mm  LV:        8.5  mm  CM:        4.3  mm  Est. FW:     545  gm      1 lb 3 oz     68  %     FW Discordancy        17  % ---------------------------------------------------------------------- Gestational Age (Fetus B)  LMP:           24w 3d        Date:  08/19/20                 EDD:   05/26/21  U/S Today:     23w 0d                                        EDD:   06/05/21  Best:          22w 3d     Det. By:  U/S Fetus A              EDD:   06/09/21                                      (12/30/20) ---------------------------------------------------------------------- Anatomy (Fetus B)  Cranium:               Appears normal         Aortic Arch:            Appears normal  Cavum:                 Appears normal         Ductal Arch:            Not well visualized  Ventricles:            Appears normal         Diaphragm:              Appears normal  Choroid Plexus:        Appears normal         Stomach:                Appears normal, left                                                                        sided  Cerebellum:            Appears normal         Abdomen:                Appears normal  Posterior Fossa:       Appears normal         Abdominal Wall:         Appears nml (cord  insert, abd wall)  Nuchal Fold:           Appears normal         Cord Vessels:           Appears normal (3                                                                        vessel cord)  Face:                  Appears normal         Kidneys:                Not well visualized                         (orbits and profile)  Lips:                  Appears normal         Bladder:                Appears normal  Thoracic:              Appears normal         Spine:                  Not well visualized  Heart:                  Appears normal         Upper Extremities:      Visualized                         (4CH, axis, and                         situs)  RVOT:                  Not well visualized    Lower Extremities:      Visualized  LVOT:                  Appears normal  Other:  Heels visualized. ---------------------------------------------------------------------- Cervix Uterus Adnexa  Cervix  Length:           4.68  cm.  Normal appearance by transabdominal scan.  Uterus  No abnormality visualized.  Right Ovary  Within normal limits.  Left Ovary  Within normal limits.  Cul De Sac  No free fluid seen.  Adnexa  No adnexal mass visualized. ---------------------------------------------------------------------- Comments  Tommy Rainwater was seen for a detailed ultrasound  due to a spontaneously conceived twin gestation.  Her  pregnancy has been complicated by maternal obesity and an  elevated MSAFP of 8.49 MoM.  She denies any significant past medical history.  She has a history of preeclampsia in her prior pregnancy.  Her current medications include a daily prenatal vitamin and  baby aspirin.  She had a cell free DNA test (Materni T21) drawn earlier in  her pregnancy that showed a low risk for trisomy 37, 58, and  11.  Female fetuses are predicted.  On today's exam,  a thick dividing membrane was noted  separating the two fetuses, indicating that these are most  likely dichorionic, diamniotic twins.  There was a question  regarding the chronicity of these twins as only a single  placenta is noted.  Twin A: Fetal hydrops is noted with scalp edema, abdominal  ascites, and skin edema of the thorax and abdomen.  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  The views of the fetal heart were unable to be visualized in  twin A due to the fetal position.  The overall EFW measures  at 1 pound 7 ounces  which is greater than the 99th percentile  primarily because of the skin edema and abdominal ascites  which is making the measurements larger.  The peak systolic  velocity of the middle cerebral arteries measured in twin A did  not indicate fetal anemia.  Doppler studies of the umbilical  arteries could not be obtained for twin A due to the fetal  position.  Twin B: Appropriate fetal growth.  The views of the fetal  anatomy were limited today due to the fetal position.  However, no obvious fetal anomalies were noted.  There was normal amniotic fluid noted around both fetuses  today.  The following were discussed with the patient today:  Hydrops of twin A  The various causes of fetal hydrops including a heart defect,  chromosomal abnormalities, isoimmunization, various  infections, fetal chest masses, or fetal tumors and other  metabolic diseases in the fetus were discussed.  The views of the fetal heart were unable to be visualized  today.  Therefore, a structural cardiac anomaly has not been  ruled out.  The patient's blood type is O+ and her antibody screen did  not indicate any abnormal antibodies, making  isoimmunization less likely.  Doppler studies of the middle  cerebral arteries performed today did not indicate anemia in  twin A.  The patient had her blood drawn today to screen for a recent  parvovirus, toxoplasmosis, and CMV infection.  There were no signs of a fetal chest mass or fetal tumors  noted on today's exam.  The patient was offered and declined an amniocentesis for  definitive diagnosis of fetal aneuploidy.  She is comfortable  with her negative cell free DNA test.  The poor prognosis for survival due to fetal hydrops noted  that such an early gestational age was discussed.  She was  advised regarding the high risk of an IUFD of twin A.  She was advised that as this is most likely a dichorionic,  diamniotic twin gestation, the demise of one fetus should not  affect the other fetus.  Management  options including an elective termination of  pregnancy were discussed.  The patient has decided to  continue the pregnancy.  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  Another indirect sign of agenesis of the corpus callosum  showing increased separation of the hemispheres along with  a midline interhemispheric cyst/dilated third ventricle were  also noted today.  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  The patient was advised that the prognosis for  children born  with agenesis of the corpus callosum vary widely from  asymptomatic with normal intellectual capacities to children  with neurodevelopmental delays, motor defects, and seizure  disorders.  The prognosis cannot be determined until after the  child is born.  Chromosomal abnormalities (especially trisomy 73 and 13)  are present in 20% of cases of agenesis of the corpus  callosum.  The increased risk of genetic syndromes that  cannot be diagnosed via prenatal ultrasounds associated with  agenesis of corpus callosum was also discussed.  The patient was offered and declined an amniocentesis today  for definitive diagnosis of fetal aneuploidy.  She is  comfortable with her negative cell free DNA test.  We will consider a fetal MRI later in her pregnancy should  twin A survive to that time.  Elevated MSAFP (8.49 MoM)  The patient was advised of the ultrasound findings that failed  to reveal an anatomical cause for the increased MSAFP.  There were no sonographic signs of spina bifida or an  anterior abdominal wall defect noted today in either fetus.  However, the views of the fetal anatomywere extremely  limited today.  She was advised regarding the limitations of  ultrasound in the detection of all  anomalies and that it will  diagnose approximately 90% of neural tube defects.  The association of an elevated MSAFP with placental  dysfunction which may manifest later in her pregnancy as  fetal growth restriction along with with other adverse  pregnancy outcomes such as fetal demise was discussed.  The patient was overwhelmed by what was discussed with  her today.  Therefore, I have scheduled a consultation with  our genetic counselor early next week to provide her with  more information.  We will continue to follow her with serial ultrasound exams for  fetal assessment.  Another ultrasound was scheduled for her in 1 week.  The patient stated that she understood the implications of  what was discussed with her today.  All conversations were held with the patient today with the  help of a Spanish interpreter.  A total of 45 minutes was spent counseling and coordinating  the care for this patient.  Greater than 50% of the time was  spent in direct face-to-face contact. ----------------------------------------------------------------------                        Ma Rings, MD Electronically Signed Corrected Final Report  02/10/2021 09:17 am ----------------------------------------------------------------------  [place] MFM OB DETAIL +14 WK  Result Date: 02/10/2021 ----------------------------------------------------------------------  OBSTETRICS REPORT                    (Corrected Final 02/10/2021 09:17 am) ---------------------------------------------------------------------- Patient Info  ID #:       [id number]                          D.O.B.:  [birth year] (24 yrs)  Name:       Amy Prince                 Visit Date: 02/06/2021 01:10 pm              ESTRADA ---------------------------------------------------------------------- Performed By  Attending:        Ma Rings MD         Ref. Address:     [address]  [place]                                                             [address]  Performed By:     Reinaldo Raddle            [facility]  Referred By:      Alberteen Spindle NP ---------------------------------------------------------------------- Orders  #  Description                           Code        Ordered By  1  [place] MFM OB DETAIL +14 WK               18299.37    Lin Landsman  2  [place] MFM OB DETAIL ADDL GEST            76811.02    CORENTHIAN     +14 WK                                            BOOKER  3  [place] MFM MCA DOPPLER                    16967.89    CORENTHIAN  BOOKER ----------------------------------------------------------------------  #  Order #                     Accession #                Episode #  1  [id number]                   [id number]                 [id number]  2  [id number]                   [id number]                 [id number]  3  [id number]                   [id number]                 [id number] ---------------------------------------------------------------------- Indications  Twin pregnancy, di/di, second trimester        O30.042  Elevated MSAFP                                 O28.9  Encounter for antenatal screening for          Z36.3  malformations  Obesity complicating pregnancy, second         O99.212  trimester (BMI 39)  Poor obstetric history: Previous               O09.299  preeclampsia / eclampsia/gestational HTN  Poor obstetric history (prior pe-term labor,   O09.219  postparum hemorrhage)  [redacted] weeks gestation of pregnancy                Z3A.22  ---------------------------------------------------------------------- Fetal Evaluation (Fetus A)  Num Of Fetuses:         2  Fetal Heart Rate(bpm):  137  Cardiac Activity:       Observed  Fetal Lie:              Maternal left side  Presentation:           Transverse, head to maternal right  Placenta:               Posterior  P. Cord Insertion:      Not well visualized  Membrane Desc:      Dividing Membrane seen  Amniotic Fluid  AFI FV:      Within normal limits                              Largest Pocket(cm)                              5.1 ---------------------------------------------------------------------- Biometry (Fetus A)  BPD:        55  mm     G. Age:  22w 5d         60  %    CI:        76.72   %    70 - 86  FL/HC:      15.7   %    18.4 - 20.2  HC:      198.9  mm     G. Age:  22w 0d         23  %    HC/AC:      0.85        1.06 - 1.25  AC:      233.6  mm     G. Age:  27w 5d       > 99  %    FL/BPD:     56.7   %    71 - 87  FL:       31.2  mm     G. Age:  19w 5d        < 1  %    FL/AC:      13.4   %    20 - 24  HUM:      31.2  mm     G. Age:  20w 3d        < 5  %  CER:      26.7  mm     G. Age:  24w 0d     > 97.7  %  NFT:      11.6  mm  LV:        6.7  mm  CM:        2.8  mm  Est. FW:     657  gm      1 lb 7 oz     99  %     FW Discordancy     0 \ 17 % ---------------------------------------------------------------------- OB History  Gravidity:    4         Term:   2        Prem:   1        SAB:   1  TOP:          0       Ectopic:  0        Living: 2 ---------------------------------------------------------------------- Gestational Age (Fetus A)  LMP:           24w 3d        Date:  08/19/20                 EDD:   05/26/21  U/S Today:     23w 0d                                        EDD:   06/05/21  Best:          22w 3d     Det. By:  U/S Fetus A              EDD:   06/09/21                                      (12/30/20)  ---------------------------------------------------------------------- Anatomy (Fetus A)  Cranium:               Appears normal         Aortic Arch:            Not well visualized  Cavum:  Not well visualized    Ductal Arch:            Not well visualized  Ventricles:            Appears normal         Diaphragm:              Appears normal  Choroid Plexus:        Appears normal         Stomach:                Appears normal, left                                                                        sided  Cerebellum:            Appears normal         Abdomen:                Abnormal, see                                                                        comments  Posterior Fossa:       Appears normal         Abdominal Wall:         Appears nml (cord                                                                        insert, abd wall)  Nuchal Fold:           Abnormal, see          Cord Vessels:           Not well visualized                         comments  Face:                  Appears normal         Kidneys:                Not well visualized                         (orbits and profile)  Lips:                  Appears normal         Bladder:                Not well visualized  Thoracic:              Abnormal, see  Spine:                  Not well visualized                         comments  Heart:                 Not well visualized    Upper Extremities:      Visualized  RVOT:                  Not well visualized    Lower Extremities:      Visualized  LVOT:                  Not well visualized  Other:  Nasal bone visualized. Fetus appears to be female. ---------------------------------------------------------------------- Doppler - Fetal Vessels (Fetus A)  Middle Cerebral Artery   S/D                RI                               PSV   MoM                                                     (cm/s)    4.5             0.78                               25.5  < 1  ---------------------------------------------------------------------- Fetal Evaluation (Fetus B)  Num Of Fetuses:         2  Fetal Heart Rate(bpm):  157  Cardiac Activity:       Observed  Fetal Lie:              Maternal right side  Presentation:           Variable  Placenta:               Fundal  P. Cord Insertion:      Visualized, central  Amniotic Fluid  AFI FV:      Within normal limits                              Largest Pocket(cm)                              4.3 ---------------------------------------------------------------------- Biometry (Fetus B)  BPD:      58.3  mm     G. Age:  23w 6d         91  %    CI:        81.46   %    70 - 86                                                          FL/HC:  18.4   %    18.4 - 20.2  HC:      203.9  mm     G. Age:  22w 4d         40  %    HC/AC:      1.08        1.06 - 1.25  AC:      188.6  mm     G. Age:  23w 4d         79  %    FL/BPD:     64.3   %    71 - 87  FL:       37.5  mm     G. Age:  22w 0d         25  %    FL/AC:      19.9   %    20 - 24  CER:        24  mm     G. Age:  22w 1d         63  %  NFT:       6.0  mm  LV:        8.5  mm  CM:        4.3  mm  Est. FW:     545  gm      1 lb 3 oz     68  %     FW Discordancy        17  % ---------------------------------------------------------------------- Gestational Age (Fetus B)  LMP:           24w 3d        Date:  08/19/20                 EDD:   05/26/21  U/S Today:     23w 0d                                        EDD:   06/05/21  Best:          22w 3d     Det. By:  U/S Fetus A              EDD:   06/09/21                                      (12/30/20) ---------------------------------------------------------------------- Anatomy (Fetus B)  Cranium:               Appears normal         Aortic Arch:            Appears normal  Cavum:                 Appears normal         Ductal Arch:            Not well visualized  Ventricles:            Appears normal         Diaphragm:              Appears normal   Choroid Plexus:        Appears normal         Stomach:  Appears normal, left                                                                        sided  Cerebellum:            Appears normal         Abdomen:                Appears normal  Posterior Fossa:       Appears normal         Abdominal Wall:         Appears nml (cord                                                                        insert, abd wall)  Nuchal Fold:           Appears normal         Cord Vessels:           Appears normal (3                                                                        vessel cord)  Face:                  Appears normal         Kidneys:                Not well visualized                         (orbits and profile)  Lips:                  Appears normal         Bladder:                Appears normal  Thoracic:              Appears normal         Spine:                  Not well visualized  Heart:                 Appears normal         Upper Extremities:      Visualized                         (4CH, axis, and                         situs)  RVOT:  Not well visualized    Lower Extremities:      Visualized  LVOT:                  Appears normal  Other:  Heels visualized. ---------------------------------------------------------------------- Cervix Uterus Adnexa  Cervix  Length:           4.68  cm.  Normal appearance by transabdominal scan.  Uterus  No abnormality visualized.  Right Ovary  Within normal limits.  Left Ovary  Within normal limits.  Cul De Sac  No free fluid seen.  Adnexa  No adnexal mass visualized. ---------------------------------------------------------------------- Comments  Tommy Rainwater was seen for a detailed ultrasound  due to a spontaneously conceived twin gestation.  Her  pregnancy has been complicated by maternal obesity and an  elevated MSAFP of 8.49 MoM.  She denies any significant past medical history.  She has a history of preeclampsia in her prior  pregnancy.  Her current medications include a daily prenatal vitamin and  baby aspirin.  She had a cell free DNA test (Materni T21) drawn earlier in  her pregnancy that showed a low risk for trisomy 47, 55, and  63.  Female fetuses are predicted.  On today's exam, a thick dividing membrane was noted  separating the two fetuses, indicating that these are most  likely dichorionic, diamniotic twins.  There was a question  regarding the chronicity of these twins as only a single  placenta is noted.  Twin A: Fetal hydrops is noted with scalp edema, abdominal  ascites, and skin edema of the thorax and abdomen.  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  The views of the fetal heart were unable to be visualized in  twin A due to the fetal position.  The overall EFW measures  at 1 pound 7 ounces which is greater than the 99th percentile  primarily because of the skin edema and abdominal ascites  which is making the measurements larger.  The peak systolic  velocity of the middle cerebral arteries measured in twin A did  not indicate fetal anemia.  Doppler studies of the umbilical  arteries could not be obtained for twin A due to the fetal  position.  Twin B: Appropriate fetal growth.  The views of the fetal  anatomy were limited today due to the fetal position.  However, no obvious fetal anomalies were noted.  There was normal amniotic fluid noted around both fetuses  today.  The following were discussed with the patient today:  Hydrops of twin A  The various causes of fetal hydrops including a heart defect,  chromosomal abnormalities, isoimmunization, various  infections, fetal chest masses, or fetal tumors and other  metabolic diseases in the fetus were discussed.  The views of the fetal heart were unable to be visualized  today.  Therefore, a structural cardiac anomaly has not been  ruled out.   The patient's blood type is O+ and her antibody screen did  not indicate any abnormal antibodies, making  isoimmunization less likely.  Doppler studies of the middle  cerebral arteries performed today did not indicate anemia in  twin A.  The patient had her blood drawn today to screen for a recent  parvovirus, toxoplasmosis, and CMV infection.  There were no signs of a fetal chest mass or fetal tumors  noted on today's exam.  The patient  was offered and declined an amniocentesis for  definitive diagnosis of fetal aneuploidy.  She is comfortable  with her negative cell free DNA test.  The poor prognosis for survival due to fetal hydrops noted  that such an early gestational age was discussed.  She was  advised regarding the high risk of an IUFD of twin A.  She was advised that as this is most likely a dichorionic,  diamniotic twin gestation, the demise of one fetus should not  affect the other fetus.  Management options including an elective termination of  pregnancy were discussed.  The patient has decided to  continue the pregnancy.  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  Another indirect sign of agenesis of the corpus callosum  showing increased separation of the hemispheres along with  a midline interhemispheric cyst/dilated third ventricle were  also noted today.  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  The patient was advised that the prognosis for children born  with agenesis of the corpus callosum vary widely from  asymptomatic with normal intellectual capacities to children  with neurodevelopmental delays, motor defects, and seizure  disorders.  The prognosis cannot be determined until after the  child is born.  Chromosomal abnormalities  (especially trisomy 79 and 13)  are present in 20% of cases of agenesis of the corpus  callosum.  The increased risk of genetic syndromes that  cannot be diagnosed via prenatal ultrasounds associated with  agenesis of corpus callosum was also discussed.  The patient was offered and declined an amniocentesis today  for definitive diagnosis of fetal aneuploidy.  She is  comfortable with her negative cell free DNA test.  We will consider a fetal MRI later in her pregnancy should  twin A survive to that time.  Elevated MSAFP (8.49 MoM)  The patient was advised of the ultrasound findings that failed  to reveal an anatomical cause for the increased MSAFP.  There were no sonographic signs of spina bifida or an  anterior abdominal wall defect noted today in either fetus.  However, the views of the fetal anatomywere extremely  limited today.  She was advised regarding the limitations of  ultrasound in the detection of all anomalies and that it will  diagnose approximately 90% of neural tube defects.  The association of an elevated MSAFP with placental  dysfunction which may manifest later in her pregnancy as  fetal growth restriction along with with other adverse  pregnancy outcomes such as fetal demise was discussed.  The patient was overwhelmed by what was discussed with  her today.  Therefore, I have scheduled a consultation with  our genetic counselor early next week to provide her with  more information.  We will continue to follow her with serial ultrasound exams for  fetal assessment.  Another ultrasound was scheduled for her in 1 week.  The patient stated that she understood the implications of  what was discussed with her today.  All conversations were held with the patient today with the  help of a Spanish interpreter.  A total of 45 minutes was spent counseling and coordinating  the care for this patient.  Greater than 50% of the time was  spent in direct face-to-face contact.  ----------------------------------------------------------------------  Ma Rings, MD Electronically Signed Corrected Final Report  02/10/2021 09:17 am ----------------------------------------------------------------------  [place] MFM OB DETAIL ADDL GEST +14 WK  Result Date: 02/10/2021 ----------------------------------------------------------------------  OBSTETRICS REPORT                    (Corrected Final 02/10/2021 09:17 am) ---------------------------------------------------------------------- Patient Info  ID #:       [id number]                          D.O.B.:  1996/03/25 (24 yrs)  Name:       Amy Prince                 Visit Date: 02/06/2021 01:10 pm              ESTRADA ---------------------------------------------------------------------- Performed By  Attending:        Ma Rings MD         Ref. Address:     [address]                                                             [place]                                                             [address]  Performed By:     Reinaldo Raddle            [facility]  Referred By:      Carlyon Shadow  CARSON NP ---------------------------------------------------------------------- Orders  #  Description                           Code        Ordered By  1  [place] MFM OB DETAIL +14 WK               76811.01    Lin Landsman  2  [place] MFM OB DETAIL ADDL GEST            76811.02    CORENTHIAN     +14 WK                                            BOOKER  3  [place] MFM MCA DOPPLER                    16109.60    Lin Landsman ----------------------------------------------------------------------  #  Order #                     Accession #                Episode #  1  [id number]                   [id number]                 [id number]  2  [id number]                   [id number]                 [id number]  3  [id number]                   [id number]                 [id number] ---------------------------------------------------------------------- Indications  Twin pregnancy, di/di, second trimester        O30.042  Elevated MSAFP                                 O28.9  Encounter for antenatal screening for          Z36.3  malformations  Obesity complicating pregnancy, second         O99.212  trimester (BMI 39)  Poor obstetric history: Previous               O09.299  preeclampsia / eclampsia/gestational HTN  Poor obstetric history (prior pe-term labor,   O09.219  postparum hemorrhage)  [redacted] weeks gestation of pregnancy  Z3A.22 ---------------------------------------------------------------------- Fetal Evaluation (Fetus A)  Num Of Fetuses:         2  Fetal Heart Rate(bpm):  137  Cardiac Activity:       Observed  Fetal Lie:              Maternal left side  Presentation:           Transverse, head to maternal right  Placenta:               Posterior  P. Cord Insertion:      Not well visualized  Membrane Desc:      Dividing Membrane seen  Amniotic Fluid  AFI FV:      Within normal limits                              Largest Pocket(cm)                              5.1 ---------------------------------------------------------------------- Biometry (Fetus A)  BPD:        55  mm     G. Age:  22w 5d         60  %    CI:        76.72   %    70 - 86                                                          FL/HC:      15.7   %    18.4 - 20.2  HC:      198.9  mm     G. Age:  22w 0d         23  %    HC/AC:      0.85        1.06 - 1.25  AC:      233.6  mm     G. Age:  27w 5d       > 99  %    FL/BPD:     56.7   %    71 - 87  FL:       31.2  mm      G. Age:  19w 5d        < 1  %    FL/AC:      13.4   %    20 - 24  HUM:      31.2  mm     G. Age:  20w 3d        < 5  %  CER:      26.7  mm     G. Age:  24w 0d     > 97.7  %  NFT:      11.6  mm  LV:        6.7  mm  CM:        2.8  mm  Est. FW:     657  gm      1 lb 7 oz     99  %     FW Discordancy     0 \ 17 % ---------------------------------------------------------------------- OB History  Gravidity:  4         Term:   2        Prem:   1        SAB:   1  TOP:          0       Ectopic:  0        Living: 2 ---------------------------------------------------------------------- Gestational Age (Fetus A)  LMP:           24w 3d        Date:  08/19/20                 EDD:   05/26/21  U/S Today:     23w 0d                                        EDD:   06/05/21  Best:          22w 3d     Det. By:  U/S Fetus A              EDD:   06/09/21                                      (12/30/20) ---------------------------------------------------------------------- Anatomy (Fetus A)  Cranium:               Appears normal         Aortic Arch:            Not well visualized  Cavum:                 Not well visualized    Ductal Arch:            Not well visualized  Ventricles:            Appears normal         Diaphragm:              Appears normal  Choroid Plexus:        Appears normal         Stomach:                Appears normal, left                                                                        sided  Cerebellum:            Appears normal         Abdomen:                Abnormal, see                                                                        comments  Posterior Fossa:  Appears normal         Abdominal Wall:         Appears nml (cord                                                                        insert, abd wall)  Nuchal Fold:           Abnormal, see          Cord Vessels:           Not well visualized                         comments  Face:                  Appears normal         Kidneys:                 Not well visualized                         (orbits and profile)  Lips:                  Appears normal         Bladder:                Not well visualized  Thoracic:              Abnormal, see          Spine:                  Not well visualized                         comments  Heart:                 Not well visualized    Upper Extremities:      Visualized  RVOT:                  Not well visualized    Lower Extremities:      Visualized  LVOT:                  Not well visualized  Other:  Nasal bone visualized. Fetus appears to be female. ---------------------------------------------------------------------- Doppler - Fetal Vessels (Fetus A)  Middle Cerebral Artery   S/D                RI                               PSV   MoM                                                     (cm/s)    4.5             0.78  25.5  < 1 ---------------------------------------------------------------------- Fetal Evaluation (Fetus B)  Num Of Fetuses:         2  Fetal Heart Rate(bpm):  157  Cardiac Activity:       Observed  Fetal Lie:              Maternal right side  Presentation:           Variable  Placenta:               Fundal  P. Cord Insertion:      Visualized, central  Amniotic Fluid  AFI FV:      Within normal limits                              Largest Pocket(cm)                              4.3 ---------------------------------------------------------------------- Biometry (Fetus B)  BPD:      58.3  mm     G. Age:  23w 6d         91  %    CI:        81.46   %    70 - 86                                                          FL/HC:      18.4   %    18.4 - 20.2  HC:      203.9  mm     G. Age:  22w 4d         40  %    HC/AC:      1.08        1.06 - 1.25  AC:      188.6  mm     G. Age:  23w 4d         79  %    FL/BPD:     64.3   %    71 - 87  FL:       37.5  mm     G. Age:  22w 0d         25  %    FL/AC:      19.9   %    20 - 24  CER:        24  mm     G. Age:  22w 1d         63  %  NFT:        6.0  mm  LV:        8.5  mm  CM:        4.3  mm  Est. FW:     545  gm      1 lb 3 oz     68  %     FW Discordancy        17  % ---------------------------------------------------------------------- Gestational Age (Fetus B)  LMP:           24w 3d        Date:  08/19/20  EDD:   05/26/21  U/S Today:     23w 0d                                        EDD:   06/05/21  Best:          22w 3d     Det. By:  U/S Fetus A              EDD:   06/09/21                                      (12/30/20) ---------------------------------------------------------------------- Anatomy (Fetus B)  Cranium:               Appears normal         Aortic Arch:            Appears normal  Cavum:                 Appears normal         Ductal Arch:            Not well visualized  Ventricles:            Appears normal         Diaphragm:              Appears normal  Choroid Plexus:        Appears normal         Stomach:                Appears normal, left                                                                        sided  Cerebellum:            Appears normal         Abdomen:                Appears normal  Posterior Fossa:       Appears normal         Abdominal Wall:         Appears nml (cord                                                                        insert, abd wall)  Nuchal Fold:           Appears normal         Cord Vessels:           Appears normal (3  vessel cord)  Face:                  Appears normal         Kidneys:                Not well visualized                         (orbits and profile)  Lips:                  Appears normal         Bladder:                Appears normal  Thoracic:              Appears normal         Spine:                  Not well visualized  Heart:                 Appears normal         Upper Extremities:      Visualized                         (4CH, axis, and                         situs)  RVOT:                   Not well visualized    Lower Extremities:      Visualized  LVOT:                  Appears normal  Other:  Heels visualized. ---------------------------------------------------------------------- Cervix Uterus Adnexa  Cervix  Length:           4.68  cm.  Normal appearance by transabdominal scan.  Uterus  No abnormality visualized.  Right Ovary  Within normal limits.  Left Ovary  Within normal limits.  Cul De Sac  No free fluid seen.  Adnexa  No adnexal mass visualized. ---------------------------------------------------------------------- Comments  Tommy Rainwater was seen for a detailed ultrasound  due to a spontaneously conceived twin gestation.  Her  pregnancy has been complicated by maternal obesity and an  elevated MSAFP of 8.49 MoM.  She denies any significant past medical history.  She has a history of preeclampsia in her prior pregnancy.  Her current medications include a daily prenatal vitamin and  baby aspirin.  She had a cell free DNA test (Materni T21) drawn earlier in  her pregnancy that showed a low risk for trisomy 47, 52, and  83.  Female fetuses are predicted.  On today's exam, a thick dividing membrane was noted  separating the two fetuses, indicating that these are most  likely dichorionic, diamniotic twins.  There was a question  regarding the chronicity of these twins as only a single  placenta is noted.  Twin A: Fetal hydrops is noted with scalp edema, abdominal  ascites, and skin edema of the thorax and abdomen.  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  The views of the fetal heart were unable to be visualized in  twin A due to the fetal  position.  The overall EFW measures  at 1 pound 7 ounces which is greater than the 99th percentile  primarily because of the skin edema and abdominal ascites  which is making the measurements larger.  The peak  systolic  velocity of the middle cerebral arteries measured in twin A did  not indicate fetal anemia.  Doppler studies of the umbilical  arteries could not be obtained for twin A due to the fetal  position.  Twin B: Appropriate fetal growth.  The views of the fetal  anatomy were limited today due to the fetal position.  However, no obvious fetal anomalies were noted.  There was normal amniotic fluid noted around both fetuses  today.  The following were discussed with the patient today:  Hydrops of twin A  The various causes of fetal hydrops including a heart defect,  chromosomal abnormalities, isoimmunization, various  infections, fetal chest masses, or fetal tumors and other  metabolic diseases in the fetus were discussed.  The views of the fetal heart were unable to be visualized  today.  Therefore, a structural cardiac anomaly has not been  ruled out.  The patient's blood type is O+ and her antibody screen did  not indicate any abnormal antibodies, making  isoimmunization less likely.  Doppler studies of the middle  cerebral arteries performed today did not indicate anemia in  twin A.  The patient had her blood drawn today to screen for a recent  parvovirus, toxoplasmosis, and CMV infection.  There were no signs of a fetal chest mass or fetal tumors  noted on today's exam.  The patient was offered and declined an amniocentesis for  definitive diagnosis of fetal aneuploidy.  She is comfortable  with her negative cell free DNA test.  The poor prognosis for survival due to fetal hydrops noted  that such an early gestational age was discussed.  She was  advised regarding the high risk of an IUFD of twin A.  She was advised that as this is most likely a dichorionic,  diamniotic twin gestation, the demise of one fetus should not  affect the other fetus.  Management options including an elective termination of  pregnancy were discussed.  The patient has decided to  continue the pregnancy.  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  Another indirect sign of agenesis of the corpus callosum  showing increased separation of the hemispheres along with  a midline interhemispheric cyst/dilated third ventricle were  also noted today.  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  The patient was advised that the prognosis for children born  with agenesis of the corpus callosum vary widely from  asymptomatic with normal intellectual capacities to children  with neurodevelopmental delays, motor defects, and seizure  disorders.  The prognosis cannot be determined until after the  child is born.  Chromosomal abnormalities (especially trisomy 50 and 13)  are present in 20% of cases of agenesis of the corpus  callosum.  The increased risk of genetic syndromes that  cannot be diagnosed via prenatal ultrasounds associated with  agenesis of corpus callosum was also discussed.  The patient was offered and declined an amniocentesis today  for definitive diagnosis of fetal aneuploidy.  She is  comfortable  with her negative cell free DNA test.  We will consider a fetal MRI later in her pregnancy should  twin A survive to that time.  Elevated MSAFP (8.49 MoM)  The patient was advised of the ultrasound findings that failed  to reveal an anatomical cause for the increased MSAFP.  There were no sonographic signs of spina bifida or an  anterior abdominal wall defect noted today in either fetus.  However, the views of the fetal anatomywere extremely  limited today.  She was advised regarding the limitations of  ultrasound in the detection of all anomalies and that it will  diagnose approximately 90% of neural tube defects.  The association of an elevated MSAFP with placental  dysfunction which  may manifest later in her pregnancy as  fetal growth restriction along with with other adverse  pregnancy outcomes such as fetal demise was discussed.  The patient was overwhelmed by what was discussed with  her today.  Therefore, I have scheduled a consultation with  our genetic counselor early next week to provide her with  more information.  We will continue to follow her with serial ultrasound exams for  fetal assessment.  Another ultrasound was scheduled for her in 1 week.  The patient stated that she understood the implications of  what was discussed with her today.  All conversations were held with the patient today with the  help of a Spanish interpreter.  A total of 45 minutes was spent counseling and coordinating  the care for this patient.  Greater than 50% of the time was  spent in direct face-to-face contact. ----------------------------------------------------------------------                        Ma Rings, MD Electronically Signed Corrected Final Report  02/10/2021 09:17 am ----------------------------------------------------------------------  [place] MFM OB FOLLOW UP  Result Date: 02/26/2021 ----------------------------------------------------------------------  OBSTETRICS REPORT                       (Signed Final 02/26/2021 03:00 pm) ---------------------------------------------------------------------- Patient Info  ID #:       [id number]                          D.O.B.:  September 27, 1995 (24 yrs)  Name:       Amy Prince                 Visit Date: 02/26/2021 02:04 pm              ESTRADA ---------------------------------------------------------------------- Performed By  Attending:        Lin Landsman      Ref. Address:     [address]                    [place]                                                             [place]  [place]                                                             [id number]  Performed By:     Sandi Mealy        [facility]  Referred By:      Alberteen Spindle NP ---------------------------------------------------------------------- Orders  #  Description                           Code        Ordered By  1  [place] MFM OB FOLLOW UP                   16109.60    YU FANG  2  [place] MFM OB FOLLOW UP ADDL              45409.81    YU FANG     GEST  3  [place] MFM MCA DOPPLER                    76821.01    YU FANG  4  [place] MFM MCA ADDL GEST                  19147.8     YU FANG ----------------------------------------------------------------------  #  Order #                     Accession #                Episode #  1  [id number]                   [id number]                 [id number]  2  [id number]                   [id number]                 [id number]  3  [id number]                   [id number]                 [id number]  4  [id number]                   [id number]                 [id number] ---------------------------------------------------------------------- Indications  Maternal care for hydrops fetalis, second      O36.22X1  trimester, fetus 1  Elevated MSAFP                                 O28.9  Twin pregnancy, di/di, second trimester        O30.042  Obesity complicating pregnancy, second         O99.212  trimester (BMI 39)  Poor obstetric history: Previous               O09.299  preeclampsia / eclampsia/gestational HTN  Poor obstetric history (prior pe-term labor,   O09.219  postparum hemorrhage)  [redacted] weeks gestation of pregnancy                Z3A.25 ---------------------------------------------------------------------- Fetal Evaluation (Fetus A)  Num Of Fetuses:         2  Fetal Heart Rate(bpm):  152  Cardiac Activity:       Observed  Fetal Lie:              Lower Fetus; Mat Lt   Presentation:           Breech  Placenta:               Posterior  P. Cord Insertion:      Previously Visualized  Amniotic Fluid  AFI FV:      Within normal limits                              Largest Pocket(cm)                              3.03 ---------------------------------------------------------------------- Biometry (Fetus A)  BPD:      55.8  mm     G. Age:  23w 0d        < 1  %    CI:        79.94   %    70 - 86                                                          FL/HC:      21.7   %    18.7 - 20.3  HC:      197.2  mm     G. Age:  21w 6d        < 1  %    HC/AC:      0.89        1.04 - 1.22  AC:       221   mm     G. Age:  26w 4d  79  %    FL/BPD:     76.5   %    71 - 87  FL:       42.7  mm     G. Age:  24w 0d          7  %    FL/AC:      19.3   %    20 - 24  HUM:      40.4  mm     G. Age:  24w 4d         26  %  Est. FW:     753  gm    1 lb 11 oz      26  %     FW Discordancy        10  % ---------------------------------------------------------------------- OB History  Gravidity:    4         Term:   2        Prem:   1        SAB:   1  TOP:          0       Ectopic:  0        Living: 2 ---------------------------------------------------------------------- Gestational Age (Fetus A)  LMP:           27w 2d        Date:  08/19/20                 EDD:   05/26/21  U/S Today:     23w 6d                                        EDD:   06/19/21  Best:          25w 2d     Det. By:  U/S Fetus A              EDD:   06/09/21                                      (12/30/20) ---------------------------------------------------------------------- Anatomy (Fetus A)  Cranium:               Skin edema             Aortic Arch:            Not well visualized  Cavum:                 Previously seen        Ductal Arch:            Not well visualized  Ventricles:            Previously seen        Diaphragm:              Abnormal, see                                                                        comments  Choroid  Plexus:        Previously seen        Stomach:                Appears normal, left                                                                        sided  Cerebellum:            Previously seen        Abdomen:                Ascites  Posterior Fossa:       Previously seen        Abdominal Wall:         Previously seen  Nuchal Fold:           Abnormal, see          Cord Vessels:           Not well visualized                         comments  Face:                  Orbits and profile     Kidneys:                Not well visualized                         previously seen  Lips:                  Previously seen        Bladder:                Appears normal  Thoracic:              Pericardial            Spine:                  Not well visualized                         effusion; skin                         edema  Heart:                 Not well visualized    Upper Extremities:      Visualized  RVOT:                  Not well visualized    Lower Extremities:      Visualized  LVOT:                  Not well visualized  Other:  Nasal bone prev visualized. Fetus appears to be female. ---------------------------------------------------------------------- Doppler - Fetal Vessels (Fetus A)  Middle Cerebral Artery  PSV   MoM                                                     (cm/s)                                                       12.1  < 1 ---------------------------------------------------------------------- Fetal Evaluation (Fetus B)  Num Of Fetuses:         2  Fetal Heart Rate(bpm):  141  Cardiac Activity:       Observed  Fetal Lie:              Upper Fetus  Presentation:           Cephalic  Placenta:               Posterior  P. Cord Insertion:      Previously Visualized  Amniotic Fluid  AFI FV:      Within normal limits                              Largest Pocket(cm)                              5.4  ---------------------------------------------------------------------- Biometry (Fetus B)  BPD:      66.9  mm     G. Age:  27w 0d         90  %    CI:        75.85   %    70 - 86                                                          FL/HC:      17.0   %    18.7 - 20.3  HC:      243.5  mm     G. Age:  26w 3d         69  %    HC/AC:      1.08        1.04 - 1.22  AC:      224.6  mm     G. Age:  26w 6d         86  %    FL/BPD:     62.0   %    71 - 87  FL:       41.5  mm     G. Age:  23w 3d          3  %    FL/AC:      18.5   %    20 - 24  HUM:      41.1  mm     G. Age:  25w 0d         33  %  Est. FW:     835  gm    1 lb 13 oz      56  %     FW Discordancy     0 \ 10 % ---------------------------------------------------------------------- Gestational Age (Fetus B)  LMP:           27w 2d        Date:  08/19/20                 EDD:   05/26/21  U/S Today:     26w 0d                                        EDD:   06/04/21  Best:          25w 2d     Det. By:  U/S Fetus A              EDD:   06/09/21                                      (12/30/20) ---------------------------------------------------------------------- Anatomy (Fetus B)  Cranium:               Previously seen        Aortic Arch:            Previously seen  Cavum:                 Previously seen        Ductal Arch:            Not well visualized  Ventricles:            Previously seen        Diaphragm:              Previously seen  Choroid Plexus:        Previously seen        Stomach:                Previously Seen  Cerebellum:            Previously seen        Abdomen:                Previously seen  Posterior Fossa:       Previously seen        Abdominal Wall:         Previously seen  Nuchal Fold:           Not applicable (>20    Cord Vessels:           Previously seen                         wks GA)  Face:                  Orbits and profile     Kidneys:                Not well visualized                         previously seen  Lips:                   Previously seen  Bladder:                Previously seen  Thoracic:              Appears normal         Spine:                  Not well visualized  Heart:                 Previously seen        Upper Extremities:      Previously seen  RVOT:                  Not well visualized    Lower Extremities:      Previously seen  LVOT:                  Previously seen  Other:  Heels prev visualized. ---------------------------------------------------------------------- Doppler - Fetal Vessels (Fetus B)  Middle Cerebral Artery                                                       PSV   MoM                                                     (cm/s)                                                      39.79  1.23 ---------------------------------------------------------------------- Impression  Follow up growth for dichorionic diamnoitic twin pregnancy  with known Anasarca in Twin A  Twin A- EFW 26th% which is over estimated due to ansarca  with ascites and pericardial effusion  Toxoplasmosis results returned from [facility]- I personally  spoke with the lab suggesting that 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 The IgM from [facility]  were negative- suggesting prior infection.  MCA Dopplers are < 1 MoM, however, the measurement was  suboptimal due to penetration and significant scalp edema. I  attempted to repeat the mesurement but was unsuccessful,  due to fetal position and edema.  Twin B normal stomach, amniotic fluid, bladder and an EFW  56 % -  Upper fetus, Cephalic  Normal MCA of 1.23 MoM.  Suboptimal views of the fetal anatomy was again observed  secondary to multiple gestation.  I discussed today's findings with Ms. Carolin Coy via interpreter.  We will discuss further with NICU via [facility] conference for  alternative approaches for a diagnosis.  She is scheduled to return on 9/16 for MCA's. ---------------------------------------------------------------------- Recommendations  Limited exam  scheduled on 9/16. ----------------------------------------------------------------------               Lin Landsman, MD Electronically Signed Final Report   02/26/2021 03:00 pm ----------------------------------------------------------------------  [place] MFM OB FOLLOW UP ADDL GEST  Result Date: 02/26/2021 ----------------------------------------------------------------------  OBSTETRICS REPORT                       (  Signed Final 02/26/2021 03:00 pm) ---------------------------------------------------------------------- Patient Info  ID #:       [id number]                          D.O.B.:  1995/11/02 (24 yrs)  Name:       Amy Prince                 Visit Date: 02/26/2021 02:04 pm              ESTRADA ---------------------------------------------------------------------- Performed By  Attending:        Lin Landsman      Ref. Address:     [address]                    [place]                                                             [place]                                                             [phone]  Performed By:     Sandi Mealy        [facility]  Referred By:      Alberteen Spindle NP ---------------------------------------------------------------------- Orders  #  Description                           Code  Ordered By  1  [place] MFM OB FOLLOW UP                   [id number]    YU FANG  2  [place] MFM OB FOLLOW UP ADDL              [id number]    YU FANG     GEST  3  [place] MFM MCA DOPPLER                    76821.01    YU FANG  4  [place] MFM MCA ADDL GEST                  16109.6     YU FANG ----------------------------------------------------------------------  #  Order #                      Accession #                Episode #  1  [id number]                   [id number]                 [id number]  2  [id number]                   [id number]                 [id number]  3  [id number]                   [id number]                 [id number]  4  [id number]                   [id number]                 [id number] ---------------------------------------------------------------------- Indications  Maternal care for hydrops fetalis, second      O36.22X1  trimester, fetus 1  Elevated MSAFP                                 O28.9  Twin pregnancy, di/di, second trimester        O30.042  Obesity complicating pregnancy, second         O99.212  trimester (BMI 39)  Poor obstetric history: Previous               O09.299  preeclampsia / eclampsia/gestational HTN  Poor obstetric history (prior pe-term labor,   O09.219  postparum hemorrhage)  [redacted] weeks gestation of pregnancy                Z3A.25 ---------------------------------------------------------------------- Fetal Evaluation (Fetus A)  Num Of Fetuses:         2  Fetal Heart Rate(bpm):  152  Cardiac Activity:       Observed  Fetal Lie:              Lower Fetus; Mat Lt  Presentation:           Breech  Placenta:               Posterior  P. Cord Insertion:      Previously Visualized  Amniotic Fluid  AFI FV:      Within normal limits  Largest Pocket(cm)                              3.03 ---------------------------------------------------------------------- Biometry (Fetus A)  BPD:      55.8  mm     G. Age:  23w 0d        < 1  %    CI:        79.94   %    70 - 86                                                          FL/HC:      21.7   %    18.7 - 20.3  HC:      197.2  mm     G. Age:  21w 6d        < 1  %    HC/AC:      0.89        1.04 - 1.22  AC:       221   mm     G. Age:  26w 4d         79  %    FL/BPD:     76.5   %    71 - 87  FL:       42.7  mm     G. Age:  24w 0d          7  %    FL/AC:      19.3   %    20 - 24  HUM:      40.4  mm     G.  Age:  24w 4d         26  %  Est. FW:     753  gm    1 lb 11 oz      26  %     FW Discordancy        10  % ---------------------------------------------------------------------- OB History  Gravidity:    4         Term:   2        Prem:   1        SAB:   1  TOP:          0       Ectopic:  0        Living: 2 ---------------------------------------------------------------------- Gestational Age (Fetus A)  LMP:           27w 2d        Date:  08/19/20                 EDD:   05/26/21  U/S Today:     23w 6d                                        EDD:   06/19/21  Best:          25w 2d     Det. By:  U/S Fetus A              EDD:   06/09/21                                      (  12/30/20) ---------------------------------------------------------------------- Anatomy (Fetus A)  Cranium:               Skin edema             Aortic Arch:            Not well visualized  Cavum:                 Previously seen        Ductal Arch:            Not well visualized  Ventricles:            Previously seen        Diaphragm:              Abnormal, see                                                                        comments  Choroid Plexus:        Previously seen        Stomach:                Appears normal, left                                                                        sided  Cerebellum:            Previously seen        Abdomen:                Ascites  Posterior Fossa:       Previously seen        Abdominal Wall:         Previously seen  Nuchal Fold:           Abnormal, see          Cord Vessels:           Not well visualized                         comments  Face:                  Orbits and profile     Kidneys:                Not well visualized                         previously seen  Lips:                  Previously seen        Bladder:                Appears normal  Thoracic:              Pericardial            Spine:  Not well visualized                         effusion; skin                          edema  Heart:                 Not well visualized    Upper Extremities:      Visualized  RVOT:                  Not well visualized    Lower Extremities:      Visualized  LVOT:                  Not well visualized  Other:  Nasal bone prev visualized. Fetus appears to be female. ---------------------------------------------------------------------- Doppler - Fetal Vessels (Fetus A)  Middle Cerebral Artery                                                       PSV   MoM                                                     (cm/s)                                                       12.1  < 1 ---------------------------------------------------------------------- Fetal Evaluation (Fetus B)  Num Of Fetuses:         2  Fetal Heart Rate(bpm):  141  Cardiac Activity:       Observed  Fetal Lie:              Upper Fetus  Presentation:           Cephalic  Placenta:               Posterior  P. Cord Insertion:      Previously Visualized  Amniotic Fluid  AFI FV:      Within normal limits                              Largest Pocket(cm)                              5.4 ---------------------------------------------------------------------- Biometry (Fetus B)  BPD:      66.9  mm     G. Age:  27w 0d         90  %    CI:        75.85   %    70 - 86  FL/HC:      17.0   %    18.7 - 20.3  HC:      243.5  mm     G. Age:  26w 3d         69  %    HC/AC:      1.08        1.04 - 1.22  AC:      224.6  mm     G. Age:  26w 6d         86  %    FL/BPD:     62.0   %    71 - 87  FL:       41.5  mm     G. Age:  23w 3d          3  %    FL/AC:      18.5   %    20 - 24  HUM:      41.1  mm     G. Age:  25w 0d         33  %  Est. FW:     835  gm    1 lb 13 oz      56  %     FW Discordancy     0 \ 10 % ---------------------------------------------------------------------- Gestational Age (Fetus B)  LMP:           27w 2d        Date:  08/19/20                 EDD:   05/26/21  U/S Today:     26w 0d                                         EDD:   06/04/21  Best:          25w 2d     Det. By:  U/S Fetus A              EDD:   06/09/21                                      (12/30/20) ---------------------------------------------------------------------- Anatomy (Fetus B)  Cranium:               Previously seen        Aortic Arch:            Previously seen  Cavum:                 Previously seen        Ductal Arch:            Not well visualized  Ventricles:            Previously seen        Diaphragm:              Previously seen  Choroid Plexus:        Previously seen        Stomach:                Previously Seen  Cerebellum:            Previously seen        Abdomen:  Previously seen  Posterior Fossa:       Previously seen        Abdominal Wall:         Previously seen  Nuchal Fold:           Not applicable (>20    Cord Vessels:           Previously seen                         wks GA)  Face:                  Orbits and profile     Kidneys:                Not well visualized                         previously seen  Lips:                  Previously seen        Bladder:                Previously seen  Thoracic:              Appears normal         Spine:                  Not well visualized  Heart:                 Previously seen        Upper Extremities:      Previously seen  RVOT:                  Not well visualized    Lower Extremities:      Previously seen  LVOT:                  Previously seen  Other:  Heels prev visualized. ---------------------------------------------------------------------- Doppler - Fetal Vessels (Fetus B)  Middle Cerebral Artery                                                       PSV   MoM                                                     (cm/s)                                                      39.79  1.23 ---------------------------------------------------------------------- Impression  Follow up growth for dichorionic diamnoitic twin pregnancy  with known Anasarca in  Twin A  Twin A- EFW 26th% which is over estimated due to ansarca  with ascites and pericardial effusion  Toxoplasmosis results returned from [facility]- I personally  spoke with the lab suggesting that 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 The IgM from [facility]  were negative-  suggesting prior infection.  MCA Dopplers are < 1 MoM, however, the measurement was  suboptimal due to penetration and significant scalp edema. I  attempted to repeat the mesurement but was unsuccessful,  due to fetal position and edema.  Twin B normal stomach, amniotic fluid, bladder and an EFW  56 % -  Upper fetus, Cephalic  Normal MCA of 1.23 MoM.  Suboptimal views of the fetal anatomy was again observed  secondary to multiple gestation.  I discussed today's findings with Ms. Carolin Coy via interpreter.  We will discuss further with NICU via [facility] conference for  alternative approaches for a diagnosis.  She is scheduled to return on 9/16 for MCA's. ---------------------------------------------------------------------- Recommendations  Limited exam scheduled on 9/16. ----------------------------------------------------------------------               Lin Landsman, MD Electronically Signed Final Report   02/26/2021 03:00 pm ----------------------------------------------------------------------  [place] MFM OB Limited  Result Date: 03/05/2021 ----------------------------------------------------------------------  OBSTETRICS REPORT                       (Signed Final 03/05/2021 12:06 pm) ---------------------------------------------------------------------- Patient Info  ID #:       [id number]                          D.O.B.:  [birth year] (24 yrs)  Name:       Amy Prince                 Visit Date: 03/05/2021 08:12 am              ESTRADA ---------------------------------------------------------------------- Performed By  Attending:        Ma Rings MD         Ref. Address:     [address]                                                              [place]                                                             [address]  Performed By:     Sandi Mealy        [facility]  Referred By:      Carlyon Shadow  CARSON NP ---------------------------------------------------------------------- Orders  #  Description                           Code        Ordered By  1  [place] MFM OB LIMITED                     [phone]    Wynelle Bourgeois ----------------------------------------------------------------------  #  Order #                     Accession #                Episode #  1  [id number]                   [id number]                 [id number] ---------------------------------------------------------------------- Indications  Maternal care for hydrops fetalis, second      O36.22X1  trimester, fetus 1  Elevated MSAFP                                 O28.9  [redacted] weeks gestation of pregnancy                Z3A.26  Twin pregnancy, di/di, second trimester        O30.042  Obesity complicating pregnancy, second         O99.212  trimester (BMI 39)  Poor obstetric history: Previous               O09.299  preeclampsia / eclampsia/gestational HTN  Poor obstetric history (prior pe-term labor,   O09.219  postparum hemorrhage) ---------------------------------------------------------------------- Fetal Evaluation (Fetus A)  Num Of Fetuses:         2  Fetal Heart Rate(bpm):  76  Cardiac Activity:       Observed  Fetal Lie:              Mat Lt Lower Fetus  Presentation:           Breech  Placenta:               Posterior  P. Cord Insertion:      Previously Visualized                              Largest Pocket(cm)                              0. ---------------------------------------------------------------------- OB History  Gravidity:    4         Term:   2        Prem:   1         SAB:   1  TOP:          0       Ectopic:  0        Living: 2 ---------------------------------------------------------------------- Gestational Age (Fetus A)  LMP:           28w 2d        Date:  08/19/20                 EDD:   05/26/21  Best:          Altamese Cabal  2d     Det. By:  U/S Fetus A              EDD:   06/09/21                                      (12/30/20) ---------------------------------------------------------------------- Fetal Evaluation (Fetus B)  Num Of Fetuses:         2  Fetal Heart Rate(bpm):  155  Cardiac Activity:       Observed  Fetal Lie:              Upper Fetus  Presentation:           Breech  Placenta:               Posterior  P. Cord Insertion:      Previously Visualized  Membrane Desc:      Dividing Membrane seen - Dichorionic.  Amniotic Fluid  AFI FV:      Within normal limits                              Largest Pocket(cm)                              2.3 ---------------------------------------------------------------------- Gestational Age (Fetus B)  LMP:           28w 2d        Date:  08/19/20                 EDD:   05/26/21  Best:          Altamese Cabal 2d     Det. By:  U/S Fetus A              EDD:   06/09/21                                      (12/30/20) ---------------------------------------------------------------------- Doppler - Fetal Vessels (Fetus B)  Middle Cerebral Artery                                                       PSV   MoM                                                     (cm/s)                                                      39.01  1.16 ---------------------------------------------------------------------- Comments  Calisha Tindel is a 25 year old gravida 4 para 1-1-  1-2 currently at 26 weeks and 2 days with a dichorionic,  diamniotic twin gestation where fetal hydrops/anasarca has  been noted in twin A.  She was seen in consultation today due to  PPROM of twin A.  Her pregnancy has also been complicated by an unexplained  elevated MSAFP of 8.49 MoM.  The  cause of fetal  hydrops/anasarca remains undetermined.  The patient's  IgM/IgG for toxoplasmosis were elevated.  However, the  toxoplasmosis IgG avidity test panel that was sent to the  [facility] lab in [facility] did not indicate an  acute toxoplasmosis infection.  Rupture of membranes for twin A was confirmed in the MAU.  A limited ultrasound performed today shows anhydramnios  around twin A.  Fetal hydrops/anasarca with abdominal and  thoracic ascites along with skin edema of the scalp, thorax,  and abdomen continues to be noted twin A.  The hydrops has  continued to worsen over the past 4 weeks in twin A.  The fetal heart rate tracing for twin A (the hydropic fetus)  shows intermittent variable decelerations.  The fetal heart  rate tracing for twin B (the normal-appearing fetus) is reactive  for her gestational age.  Frequent contractions every 2 to 4  minutes are noted on the toco.  Due to PPROM, the patient was given a course of antenatal  corticosteroids, started on latency antibiotics, and started on  magnesium sulfate for tocolysis and fetal neuroprotection.  The typical management of patients with PPROM was  reviewed.  The patient was advised that due to PPROM, she will remain  in the hospital until delivery. She should be placed on latency  antibiotics for 7 days and receive a complete course of  antenatal corticosteroids.  She should be kept on magnesium sulfate for tocolysis and  fetal neuro protection until her steroid course is completed.  Magnesium sulfate for fetal neuroprotection should be  restarted should she be at risk of delivery prior to 32 weeks.  The poor prognosis for twin A (the hydropic fetus) was  discussed with the patient and her partner.  They were  advised that there may be limited treatment options for that  baby even after delivery.  I discussed her case with the NICU.  They stated that as fetal hydrops/anasarca has been noted  for a prolonged period of time, the  prognosis for that baby is  probably not good.  They will come and speak with the patient  to discuss treatment options for the baby after delivery.  As recurrent variable decelerations have been noted in twin A  (the hydropic fetus), the patient was asked if she would want  [place] to proceed with delivery for nonreassuring fetal status to  prevent a fetal demise of twin A.  Delivery for the benefit of  twin A would subject twin B (the normal-appearing fetus) to  the effects of extreme prematurity.  As there are probably limited treatment options for twin A  after delivery and as there is no guarantee of survival even  with treatment, the patient and her partner stated that they  would like to do everything possible to give twin B (the normal-  appearing fetus) the best possible chances of having a good  long-term outcome, including allowing an IUFD of twin A to  occur.  The patient and her partner were advised that this is a  very difficult ethical dilemma.  They may still change their  minds after speaking with the NICU.  As the couple would not want to proceed with delivery for  nonreassuring fetal status of twin A at this time, twin A may  be taken off continuous monitoring.  Continuous monitoring  will  be kept for twin B.  The patient and her partner were advised that as this is a  dichorionic twin gestation, the demise of one twin will  probably not have any effects on the surviving twin.  We will continue the usual treatment plan for patient's that  have ruptured membranes for now.  I will readdress the issue  of delivery for nonreassuring fetal status for twin A once the  patient and her partner have spoken to the NICU.  All conversations were held with the patient today with the  help of a Spanish interpreter.  Recommendations:  Inpatient management until delivery due to PPROM  Administer a complete course of antenatal corticosteroids,  latency antibiotics, and magnesium for fetal neuro protection  as needed   Intermittent fetal monitoring for twin A  Continuous monitoring for twin B  Goal for delivery is at 34 weeks  Delivery prior to 34 weeks would be indicated:  for nonreassuring fetal status of twin B  should she show any signs of an intrauterine infection           should she go into spontaneous labor ----------------------------------------------------------------------                   Ma Rings, MD Electronically Signed Final Report   03/05/2021 12:06 pm ----------------------------------------------------------------------  [place] MFM OB LIMITED  Result Date: 02/21/2021 ----------------------------------------------------------------------  OBSTETRICS REPORT                        (Signed Final 02/21/2021 06:23 am) ---------------------------------------------------------------------- Patient Info  ID #:       [id number]                          D.O.B.:  [birth year] (24 yrs)  Name:       Amy Prince                 Visit Date: 02/20/2021 04:30 pm              ESTRADA ---------------------------------------------------------------------- Performed By  Attending:        Ma Rings MD         Ref. Address:      [address]                                                              [place]                                                              [address]  Performed By:     Hurman Horn          [facility]  Referred By:      Alberteen Spindle NP ---------------------------------------------------------------------- Orders  #  Description                           Code        Ordered By  1  [place] MFM OB LIMITED                     76815.01    YU FANG  2  [place] MFM MCA DOPPLER                     76821.01    YU FANG  3  [place] MFM MCA ADDL GEST RE               16109.6     YU FANG     EVAL ----------------------------------------------------------------------  #  Order #                     Accession #                Episode #  1  [id number]                   [id number]                 [id number]  2  [id number]                   [id number]                 [id number]  3  [id number]                   [id number]                 [id number] ---------------------------------------------------------------------- Indications  Maternal care for hydrops fetalis, second       O36.22X1  trimester, fetus 1  Elevated MSAFP                                  O28.9  [redacted] weeks gestation of pregnancy  Z3A.24  Twin pregnancy, di/di, second trimester         O30.042  Obesity complicating pregnancy, second          O99.212  trimester (BMI 39)  Poor obstetric history: Previous                O09.299  preeclampsia / eclampsia/gestational HTN  Poor obstetric history (prior pe-term labor,    O09.219  postparum hemorrhage) ---------------------------------------------------------------------- Fetal Evaluation (Fetus A)  Num Of Fetuses:          2  Fetal Heart Rate(bpm):   148  Cardiac Activity:        Observed  Fetal Lie:               Maternal left side; lower fetus  Presentation:            Cephalic  Placenta:                Posterior  Membrane Desc:      Dividing Membrane seen  Amniotic Fluid  AFI FV:      Within normal limits                              Largest Pocket(cm)                              3.62  Comment:    Stomach, bladder, and diaphragm seen. ---------------------------------------------------------------------- OB History  Gravidity:    4         Term:   2        Prem:   1        SAB:   1  TOP:          0       Ectopic:  0        Living: 2 ---------------------------------------------------------------------- Gestational Age (Fetus A)  LMP:           26w 3d        Date:  08/19/20                 EDD:    05/26/21  Best:          Darien Ramus 3d     Det. By:  U/S Fetus A              EDD:   06/09/21                                      (12/30/20) ---------------------------------------------------------------------- Doppler - Fetal Vessels (Fetus A)  Middle Cerebral Artery    S/D               RI                               PSV    MoM                                                     (cm/s)   6.11             0.81  23.95   < 1 ---------------------------------------------------------------------- Fetal Evaluation (Fetus B)  Num Of Fetuses:          2  Fetal Heart Rate(bpm):   145  Cardiac Activity:        Observed  Fetal Lie:               Upper Fetus  Presentation:            Cephalic  Placenta:                Posterior  Membrane Desc:      Dividing Membrane seen  Amniotic Fluid  AFI FV:      Within normal limits                              Largest Pocket(cm)                              2.58  Comment:    Bladder, stomach, and diaphragm seen. ---------------------------------------------------------------------- Gestational Age (Fetus B)  LMP:           26w 3d        Date:  08/19/20                 EDD:   05/26/21  Best:          Darien Ramus 3d     Det. By:  U/S Fetus A              EDD:   06/09/21                                      (12/30/20) ---------------------------------------------------------------------- Doppler - Fetal Vessels (Fetus B)  Middle Cerebral Artery    S/D                                PI    %tile     PSV    MoM                                                     (cm/s)   5.44                              1.31    < 2.5    35.54   1.15 ---------------------------------------------------------------------- Cervix Uterus Adnexa  Cervix  Length:           3.05  cm.  Normal appearance by transabdominal scan.  Uterus  No abnormality visualized.  Right Ovary  Not visualized.  Left Ovary  Not visualized.  Adnexa  No abnormality visualized.  ---------------------------------------------------------------------- Comments  This patient was seen for a follow-up ultrasound exam as  fetal hydrops of twin A was noted during her prior exam in this  dichorionic, diamniotic twin gestation.  She denies any  problems since her last exam.  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  On today's exam, a heartbeat is still present in twin A.  Whole-  body edema (anasarca)  and abdominal and thoracic ascites  conrinues to be noted in Twin A.  Doppler studies of the middle cerebral arteries performed in  twin A and Twin B did not indicate any signs of fetal anemia.  There were no signs of hydrops noted in twin B.  There was normal amniotic fluid noted around both fetuses.  The patient was advised that due to significant fetal hydrops  in twin A, the most likely outcome is an IUFD at some point in  her pregnancy.  We will follow up on the results of her toxoplasmosis IgG  avidity test.  Should the avidity test show low avidity,  indicating a recent infection, we will institute the appropriate  treatments for her.  The patient was advised that even if treatment is started for  toxoplasmosis, due to anasarca in twin A, the prognosis for  that fetus is still poor.  The patient was advised that we will continue to follow her  with weekly ultrasound exams for fetal assessment.  Should a fetal heartbeat be present in twin A in a few weeks,  we will consult with the NICU to determine if they can offer  Twin A treatment for hydrops after delivery.  She understands that if we were to proceed with delivery for  the benefit of twin A, twin B (the normal-appearing fetus) may  be subjected to the effects of  prematurity.  We will only  consider an earlier delivery if the NICU can offer treatment for  twin A after delivery. It is the decision that she will have to  make at that time.  She will return in 1 week for another ultrasound exam.  All  conversations were held with the patient today with the  help of a Spanish interpreter. ----------------------------------------------------------------------                   Ma Rings, MD Electronically Signed Final Report   02/21/2021 06:23 am ----------------------------------------------------------------------  [place] MFM OB LIMITED  Result Date: 02/14/2021 ----------------------------------------------------------------------  OBSTETRICS REPORT                        (Signed Final 02/14/2021 07:14 am) ---------------------------------------------------------------------- Patient Info  ID #:       [id number]                          D.O.B.:  Aug 28, 1995 (24 yrs)  Name:       Amy Prince                 Visit Date: 02/13/2021 03:38 pm              ESTRADA ---------------------------------------------------------------------- Performed By  Attending:        Ma Rings MD         Ref. Address:      [address]                                                              [place]  [place]                                                              [id number]  Performed By:     Fayne Norrie BS,      [facility]                    RD[facility], RVT                                 [facility]  Referred By:      Alberteen Spindle NP ---------------------------------------------------------------------- Orders  #  Description                           Code        Ordered By  1  [place] MFM MCA DOPPLER                    76821.01    YU FANG  2  [place] MFM MCA ADDL GEST                  60454.0     YU FANG  3  [place] MFM OB LIMITED                     76815.01    YU FANG ----------------------------------------------------------------------  #  Order #                     Accession #                 Episode #  1  [id number]                   [id number]  2  [id number]                   [id number]                 [id number]  3  [id number]                   [id number]  [id number] ---------------------------------------------------------------------- Indications  Maternal care for hydrops fetalis, second       O36.22X1  trimester, fetus 1  Twin pregnancy, di/di, second trimester         O30.042  Elevated MSAFP                                  O28.9  Obesity complicating pregnancy, second          O99.212  trimester (BMI 39)  Poor obstetric history: Previous                O09.299  preeclampsia / eclampsia/gestational HTN  Poor obstetric history (prior pe-term labor,    O09.219  postparum hemorrhage)  [redacted] weeks gestation of pregnancy                 Z3A.23 ---------------------------------------------------------------------- Fetal Evaluation (Fetus A)  Num Of Fetuses:          2  Fetal Heart Rate(bpm):   150  Cardiac Activity:        Observed  Fetal Lie:               Lower Fetus; Maternal Left  Presentation:            Breech  Placenta:                Posterior  P. Cord Insertion:       Not well visualized  Membrane Desc:      Dividing Membrane seen - Dichorionic.  Amniotic Fluid  AFI FV:      Within normal limits                              Largest Pocket(cm)                              8.71 ---------------------------------------------------------------------- Biometry (Fetus A)  LV:        8.7  mm ---------------------------------------------------------------------- OB History  Gravidity:    4         Term:   2        Prem:   1        SAB:   1  TOP:          0       Ectopic:  0        Living: 2 ---------------------------------------------------------------------- Gestational Age (Fetus A)  LMP:           25w 3d        Date:  08/19/20                 EDD:   05/26/21  Best:          23w 3d     Det. By:  U/S Fetus A              EDD:   06/09/21                                       (12/30/20) ---------------------------------------------------------------------- Anatomy (Fetus A)  Cranium:               Skin edema             Abdomen:  Ascites  Ventricles:            Upper limits of        Abdominal Wall:         Abnormal, see                         normal                                                                        comments  Thoracic:              Pleural effusion;l     Kidneys:                Appear normal                         skin edema  Heart:                 Appears normal         Bladder:                Appears normal                         (4CH, axis, and                         situs)  Diaphragm:             Appears normal ---------------------------------------------------------------------- Doppler - Fetal Vessels (Fetus A)  Middle Cerebral Artery                      RI               PI    %tile     PSV    MoM                                                     (cm/s)                    0.76              1.7       37    25.96   < 1 ---------------------------------------------------------------------- Fetal Evaluation (Fetus B)  Num Of Fetuses:          2  Fetal Heart Rate(bpm):   158  Cardiac Activity:        Observed  Fetal Lie:               Upper Fetus  Presentation:            Cephalic  Placenta:                Fundal  P. Cord Insertion:       Previously Visualized  Membrane Desc:      Dividing Membrane seen - Dichorionic.  Amniotic Fluid  AFI FV:      Within  normal limits                              Largest Pocket(cm)                              4.75 ---------------------------------------------------------------------- Biometry (Fetus B)  LV:        5.1  mm ---------------------------------------------------------------------- Gestational Age (Fetus B)  LMP:           25w 3d        Date:  08/19/20                 EDD:   05/26/21  Best:          23w 3d     Det. By:  U/S Fetus A              EDD:   06/09/21                                       (12/30/20) ---------------------------------------------------------------------- Anatomy (Fetus B)  Ventricles:            Appears normal         Stomach:                Appears normal, left                                                                        sided  Thoracic:              Appears normal         Kidneys:                Appear normal  Heart:                 Appears normal         Bladder:                Appears normal                         (4CH, axis, and                         situs) ---------------------------------------------------------------------- Doppler - Fetal Vessels (Fetus B)  Middle Cerebral Artery                      RI               PI    %tile     PSV    MoM                                                     (cm/s)                    0.93  2.32       91    35.46   1.2 ---------------------------------------------------------------------- Comments  This patient was seen for a follow-up ultrasound exam as  fetal hydrops of twin A was noted during her last exam in this  dichorionic, diamniotic twin gestation.  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  I  made a call to [facility] and had them add on the confirmatory  toxoplasmosis IgG avidity test.  The results are currently  pending.  She denies any problems since her last exam.  On today's exam, a heartbeat is still present in twin A.  Twin A  now has whole-body edema (anasarca) and abdominal and  thoracic ascites which is worse than her exam last week.  Doppler studies of the middle cerebral arteries performed in  twin A did not indicate any signs of fetal anemia.  The patient was advised that due to significant fetal hydrops  in twin A, the most likely outcome is an IUFD at some point in  her pregnancy.  She was reassured that as this is a dichorionic twin gestation,  the demise of twin A will probably not have any effects on  twin B.  She  was advised that should she have any acute  toxoplasmosis infection, twin B was also be at risk for the  effects of the infection.  There were no signs of hydrops in twin B.  There was normal  amniotic fluid noted around both fetuses.  We will follow up on the results of her toxoplasmosis IgG  avidity test.  Should the avidity test show low avidity,  indicating a recent infection, we will institute the appropriate  treatments for her.  The patient was advised that even if treatment is started for  toxoplasmosis, due to anasarca in twin A, the prognosis for  that fetus is still poor.  We have looked into the possibility of selective reduction of  twin A.  However, she is too far along in her pregnancy for  selective reduction.   She will return in 1 week for another ultrasound exam.   All conversations were held with the patient today with the  help of a Spanish interpreter. ----------------------------------------------------------------------                   Ma Rings, MD Electronically Signed Final Report   02/14/2021 07:14 am ----------------------------------------------------------------------    Assessment and Plan: IUP 26 2/7 weeks DI/DI twins PROM Twin A Fetal Hydrops, Tw A Malpresentation, Breech/ Breech Unwanted fertility      Admit to Antenatal Betamethasone x 2 doses Magnesium sulfate for CP prophylaxis. Pt has been extensive counseled by Dr. Wilhelmenia Blase from MFM. See his note. As per pt request at this time, will proceed to delivery on basis of Tw B FHT's, labor or infection. NICU consult pending. Will reevaluate plan after NICU consult, if mother changes her mind.  Pt aware of need for c section for delivery at this time with BTL per pt request.  Routine antenatal care  Massey Ruhland L. Alysia Penna, MD, FACOG Attending Obstetrician & Gynecologist Faculty Practice, [facility]

## 2021-03-06 ENCOUNTER — Ambulatory Visit: Payer: Self-pay

## 2021-03-06 LAB — GC/CHLAMYDIA PROBE AMP (~~LOC~~) NOT AT ARMC
Chlamydia: NEGATIVE
Comment: NEGATIVE
Comment: NORMAL
Neisseria Gonorrhea: NEGATIVE

## 2021-03-06 MED ORDER — DIPHENHYDRAMINE HCL 25 MG PO CAPS
25.0000 mg | ORAL_CAPSULE | Freq: Four times a day (QID) | ORAL | Status: DC | PRN
  Administered 2021-03-06: 25 mg via ORAL
  Filled 2021-03-06: qty 1

## 2021-03-06 MED ORDER — DOCUSATE SODIUM 100 MG PO CAPS
100.0000 mg | ORAL_CAPSULE | Freq: Two times a day (BID) | ORAL | Status: DC | PRN
  Administered 2021-03-10: 100 mg via ORAL
  Filled 2021-03-06: qty 1

## 2021-03-06 NOTE — Progress Notes (Signed)
Patient ID: Amy Prince, female   DOB: 01-27-1996, 25 y.o.   MRN: [id number] ACULTY PRACTICE ANTEPARTUM COMPREHENSIVE PROGRESS NOTE  Amy Prince is a 25 y.o. [id number] at [redacted]w[redacted]d  who is admitted for PROM, Tw A, in setting of DI/DI twins. Tw A hydrops   Fetal presentation is breech/breech Length of Stay:  1  Days  Subjective: Pt without complaints this morning.  Patient reports good fetal movement.  She reports no  uterine contractions, no bleeding and no loss of fluid per vagina.  Vitals:  Blood pressure (!) 103/51, pulse (!) 101, temperature 98 F (36.7 C), temperature source Oral, resp. rate 18, height 5\' 2"  (1.575 m), weight 104.3 kg, last menstrual period 08/19/2020, SpO2 99 %, unknown if currently breastfeeding. Physical Examination: Lungs clear Heart RRR Abd soft + BS grvaid non tende Ext non tender   Fetal Monitoring:  Baseline: 140's bpm, Variability: Good {> 6 bpm), Accelerations: Reactive, and Decelerations: Absent  Labs:  No results found for this or any previous visit (from the past 24 hour(s)).  Imaging Studies:    NA   Medications:  Scheduled  [START ON 03/07/2021] amoxicillin  500 mg Oral TID   docusate sodium  100 mg Oral Daily   prenatal multivitamin  1 tablet Oral Q1200   I have reviewed the patient's current medications.  ASSESSMENT: IUP 27 3/7 weeks PROM Tw A DI/DI twins Malpresentation, breech/breech Tw A, hydrops Unwanted fertility  PLAN: Stable. Second dose of BMZ today. Magnesium x 48 hrs. Antibiotics x 7 days. Delivery based on fetal well being of Tw B and maternal indications. S/P MFM and NICU consults Continue routine antenatal care.   5/7 03/06/2021,12:10 PM

## 2021-03-07 LAB — BILE ACIDS, TOTAL: Bile Acids Total: 22.1 umol/L — ABNORMAL HIGH (ref 0.0–10.0)

## 2021-03-07 NOTE — Progress Notes (Addendum)
Daily Antepartum Note  Admission Date: 03/05/2021 Current Date: 03/07/2021 7:44 AM  Amy Prince is a 25 y.o. [id number] @ [redacted]w[redacted]d, HD#2, admitted for PPROM of twin A.  Pregnancy complicated by: Patient Active Problem List   Diagnosis Date Noted   Preterm premature rupture of membranes (PPROM)  03/05/2021   [redacted] weeks gestation of pregnancy 03/05/2021   Abnormal fetal ultrasound 02/09/2021   Increased antenatal AFP screen 01/12/2021   Supervision of high risk pregnancy, antepartum 01/09/2021   Antepartum bleeding, second trimester 12/12/2020   Twin pregnancy, antepartum 12/11/2020   History of pre-eclampsia in prior pregnancy, currently pregnant 12/01/2020   Obesity in pregnancy 11/23/2018   Language barrier 11/08/2018    Overnight/24hr events:  None  Subjective:  No s/s of labor or infection.   Objective:    Current Vital Signs 24h Vital Sign Ranges  T 98.3 F (36.8 C) Temp  Avg: 98.2 F (36.8 C)  Min: 98 F (36.7 C)  Max: 98.5 F (36.9 C)  BP 109/60 BP  Min: 103/51  Max: 121/66  HR 95 Pulse  Avg: 99  Min: 94  Max: 103  RR 16 Resp  Avg: 16.8  Min: 16  Max: 18  SaO2 100 % Room Air SpO2  Avg: 98.6 %  Min: 98 %  Max: 100 %       24 Hour I/O Current Shift I/O  Time Ins Outs 09/16 0701 - 09/17 0700 In: 3780.2 [P.O.:1556; I.V.:2024.2] Out: 3500 [Urine:2800] No intake/output data recorded.   Patient Vitals for the past 24 hrs:  BP Temp Temp src Pulse Resp SpO2  03/07/21 0415 109/60 98.3 F (36.8 C) Oral 95 16 100 %  03/06/21 2352 (!) 114/57 98 F (36.7 C) Oral 94 16 98 %  03/06/21 2035 -- -- -- -- -- 98 %  03/06/21 1955 109/61 98.2 F (36.8 C) Oral (!) 102 18 --  03/06/21 1615 -- -- -- -- -- 98 %  03/06/21 1612 121/66 98.5 F (36.9 C) Oral (!) 103 16 --  03/06/21 1150 (!) 103/51 98 F (36.7 C) Oral (!) 101 18 99 %   UOP:>137mL/hr  Fetal Heart Tones:  A-not being monitored B-145 baseline, +accels, no decel, mod variability Tocometry: quiet  Physical  exam: General: Well nourished, well developed female in no acute distress. Abdomen: gravid nttp Respiratory: no respiratory distress Extremities: no clubbing, cyanosis or edema Skin: Warm and dry.   Medications: Current Facility-Administered Medications  Medication Dose Route Frequency Provider Last Rate Last Admin   acetaminophen (TYLENOL) tablet 650 mg  650 mg Oral Q4H PRN Anyanwu, Jethro Bastos, MD   650 mg at 03/06/21 4332   amoxicillin (AMOXIL) capsule 500 mg  500 mg Oral TID Anyanwu, Ugonna A, MD       calcium carbonate (TUMS - dosed in mg elemental calcium) chewable tablet 400 mg of elemental calcium  2 tablet Oral Q4H PRN Anyanwu, Ugonna A, MD       diphenhydrAMINE (BENADRYL) capsule 25 mg  25 mg Oral Q6H PRN Zion Bing, MD   25 mg at 03/06/21 1626   docusate sodium (COLACE) capsule 100 mg  100 mg Oral BID PRN Sayner Bing, MD       lactated ringers infusion   Intravenous Continuous [place] Bing, MD 50 mL/hr at 03/06/21 1628 New Bag at 03/06/21 1628   magnesium sulfate 40 grams in SWI 1000 mL OB infusion  2 g/hr Intravenous Titrated  Bing, MD 50 mL/hr at 03/06/21 1628 2 g/hr  at 03/06/21 1628   prenatal multivitamin tablet 1 tablet  1 tablet Oral Q1200 Anyanwu, Ugonna A, MD   1 tablet at 03/06/21 0900   zolpidem (AMBIEN) tablet 5 mg  5 mg Oral QHS PRN Anyanwu, Jethro Bastos, MD        Labs:  Recent Labs  Lab 03/05/21 0735  WBC 8.0  HGB 12.4  HCT 36.8  PLT 250    Recent Labs  Lab 03/05/21 0735  NA 137  K 4.0  CL 104  CO2 22  BUN 6  CREATININE 0.50  CALCIUM 10.2  PROT 6.4*  BILITOT 0.6  ALKPHOS 180*  ALT 25  AST 25  GLUCOSE 92     Radiology:  No new imaging 9/15: A breech with oligo, HR 70s/B breech with normal AFI, HR (150s) and MCA dopplers 9/8: B 835gm, efw 56%,   Assessment & Plan:  Pt doing well *Pregnancy: BID NSTs -d/w her she will likely need a c-section, which she is okay with. Pt unsure about BTL and would like IUD right now; pt  is self pay. D/w her that can do BTL if she desires but anything else would have to be done at the HD *Di-Di twins: see below *Multiple anomalies for twin A: long d/w NICU and MFM with the patient and plan is to not monitor A and maintain pregnancy for benefit of B. Ask MFM if need for qwk dopplers, AFI for twin B -s/p normal fetal echo x 2 *PPROM of twin A: d/c Mg this morning. continue latency abx *Preterm: consider repeat bmz on 9/30.  -s/p bmz on 9/15-16. S/p NICU consult. S/p Mg on admit *PPx: SCDs *FEN/GI: regular diet. SLIV *Dispo: inpatient until delivery  Interpreter used  Cornelia Copa MD Attending Center for [facility]) GYN Consult Phone: [phone] (M-F, [phone]) & [phone]  (Off hours, weekends, holidays)

## 2021-03-08 LAB — CBC
HCT: 31.7 % — ABNORMAL LOW (ref 36.0–46.0)
Hemoglobin: 10.4 g/dL — ABNORMAL LOW (ref 12.0–15.0)
MCH: 30.1 pg (ref 26.0–34.0)
MCHC: 32.8 g/dL (ref 30.0–36.0)
MCV: 91.6 fL (ref 80.0–100.0)
Platelets: 231 10*3/uL (ref 150–400)
RBC: 3.46 MIL/uL — ABNORMAL LOW (ref 3.87–5.11)
RDW: 13.9 % (ref 11.5–15.5)
WBC: 7.9 10*3/uL (ref 4.0–10.5)
nRBC: 0.6 % — ABNORMAL HIGH (ref 0.0–0.2)

## 2021-03-08 LAB — CULTURE, BETA STREP (GROUP B ONLY)

## 2021-03-08 NOTE — Progress Notes (Signed)
FACULTY PRACTICE ANTEPARTUM(COMPREHENSIVE) NOTE  Amy Prince is a 25 y.o. [phone] with Estimated Date of Delivery: 06/09/21   By  LMP [redacted]w[redacted]d  who is admitted for PPROM of Twin A with di/di twins  Fetal presentation is breech/breech Length of Stay:  3  Days  Date of admission:03/05/2021  Subjective: Pt resting comfortably in bed, reports no acute complaints this am. Patient reports the fetal movement as active. Patient reports uterine contraction  activity as none. Patient reports  vaginal bleeding as none. Patient describes fluid per vagina as Other minimal.  Vitals:  Blood pressure (!) 94/44, pulse 75, temperature 99.3 F (37.4 C), temperature source Oral, resp. rate 16, height 5\' 2"  (1.575 m), weight 104.3 kg, last menstrual period 08/19/2020, SpO2 100 %, unknown if currently breastfeeding. Vitals:   03/07/21 1528 03/07/21 2042 03/08/21 0113 03/08/21 0434  BP: (!) 101/43 (!) 92/46 (!) 103/51 (!) 94/44  Pulse: 90 76 76 75  Resp: 16 18 18 16   Temp: 98.3 F (36.8 C) 98.5 F (36.9 C) 97.8 F (36.6 C) 99.3 F (37.4 C)  TempSrc: Oral Oral Oral Oral  SpO2: 100% 99% 100% 100%  Weight:      Height:       Physical Examination:  General appearance - alert, well appearing, and in no distress Mental status - normal mood, behavior, speech, dress, motor activity, and thought processes Chest - CTAB Heart - normal rate and regular rhythm Abdomen - gravid, soft and non-tender Extremities - no edema, no calf tenderness bilaterally Skin - warm and dry   Fetal Monitoring:  Twin B- Baseline: 150 bpm, Variability: moderate, Accelerations: +15x15 accels, and Decelerations: Absent     Reactive NST  Labs:  Results for orders placed or performed during the hospital encounter of 03/05/21 (from the past 24 hour(s))  CBC   Collection Time: 03/08/21  4:22 AM  Result Value Ref Range   WBC 7.9 4.0 - 10.5 K/uL   RBC 3.46 (L) 3.87 - 5.11 MIL/uL   Hemoglobin 10.4 (L) 12.0 - 15.0 g/dL    HCT 03/07/21 (L) 03/10/21 - 46.0 %   MCV 91.6 80.0 - 100.0 fL   MCH 30.1 26.0 - 34.0 pg   MCHC 32.8 30.0 - 36.0 g/dL   RDW 94.7 65.4 - 65.0 %   Platelets 231 150 - 400 K/uL   nRBC 0.6 (H) 0.0 - 0.2 %  Type and screen [facility]   Collection Time: 03/08/21  4:22 AM  Result Value Ref Range   ABO/RH(D) PENDING    Antibody Screen PENDING    Sample Expiration      03/11/2021,2359 Performed at [facility], [address] [place], [address] [place]    I have reviewed the patient's current medications.  ASSESSMENT: [place] [redacted]w[redacted]d Estimated Date of Delivery: 06/09/21  Patient Active Problem List   Diagnosis Date Noted   Preterm premature rupture of membranes (PPROM)  03/05/2021   [redacted] weeks gestation of pregnancy 03/05/2021   Abnormal fetal ultrasound 02/09/2021   Increased antenatal AFP screen 01/12/2021   Supervision of high risk pregnancy, antepartum 01/09/2021   Antepartum bleeding, second trimester 12/12/2020   Twin pregnancy, antepartum 12/11/2020   History of pre-eclampsia in prior pregnancy, currently pregnant 12/01/2020   Obesity in pregnancy 11/23/2018   Language barrier 11/08/2018    PLAN: 1) PPROM -continue latency antibiotics -no evidence of labor S/p BMZ 9/15-16  2) Di/Di twins -Twin B- Reactive NST, continue q shift monitoring -Multiple anomalies  for twin A: long d/w NICU and MFM with the patient and plan is to not monitor A and maintain pregnancy for benefit of B -Route of delivery- C-section  3) Maternal care -regular diet -activity as tolerated -SCDs for DVT prophylaxis  DISP: continue inpatient management until delivery- ideally 34wks or as clinically indicated  Sharon Seller 03/08/2021,7:34 AM

## 2021-03-08 NOTE — Progress Notes (Addendum)
Blood bank/[facility] called and noted that patient is antibody positive, but has now developed a 2nd antibody. Should pt need blood today, will need emergency release. This could change after crossmatching complete. Crossmatching currently in progress. Blood bank to update when compatibility complete. MD/Dr. Debroah Loop notified.

## 2021-03-09 ENCOUNTER — Encounter (HOSPITAL_COMMUNITY)
Admission: AD | Disposition: A | Discharge status: Home / Self Care | Payer: Self-pay | Source: Home / Self Care | Attending: Obstetrics and Gynecology

## 2021-03-09 ENCOUNTER — Inpatient Hospital Stay (HOSPITAL_COMMUNITY): Payer: Medicaid Other | Admitting: Certified Registered Nurse Anesthetist

## 2021-03-09 ENCOUNTER — Encounter (HOSPITAL_COMMUNITY): Payer: Self-pay | Admitting: Obstetrics & Gynecology

## 2021-03-09 DIAGNOSIS — O42912 Preterm premature rupture of membranes, unspecified as to length of time between rupture and onset of labor, second trimester: Principal | ICD-10-CM

## 2021-03-09 LAB — CBC
HCT: 33 % — ABNORMAL LOW (ref 36.0–46.0)
Hemoglobin: 11.1 g/dL — ABNORMAL LOW (ref 12.0–15.0)
MCH: 30.6 pg (ref 26.0–34.0)
MCHC: 33.6 g/dL (ref 30.0–36.0)
MCV: 90.9 fL (ref 80.0–100.0)
Platelets: 246 K/uL (ref 150–400)
RBC: 3.63 MIL/uL — ABNORMAL LOW (ref 3.87–5.11)
RDW: 13.3 % (ref 11.5–15.5)
WBC: 14.9 K/uL — ABNORMAL HIGH (ref 4.0–10.5)
nRBC: 0.1 % (ref 0.0–0.2)

## 2021-03-09 LAB — BILE ACIDS, TOTAL: Bile Acids Total: 13 umol/L — ABNORMAL HIGH (ref 0.0–10.0)

## 2021-03-09 LAB — CREATININE, SERUM
Creatinine, Ser: 0.42 mg/dL — ABNORMAL LOW (ref 0.44–1.00)
GFR, Estimated: >60 mL/min (ref >60)

## 2021-03-09 SURGERY — Surgical Case
Anesthesia: Spinal | Site: Abdomen | Wound class: Clean Contaminated

## 2021-03-09 MED ORDER — NALBUPHINE HCL 10 MG/ML IJ SOLN: 5.0000 mg | INTRAMUSCULAR | Status: DC | PRN

## 2021-03-09 MED ORDER — PHENYLEPHRINE HCL-NACL 20-0.9 MG/250ML-% IV SOLN
INTRAVENOUS | Status: AC
  Filled 2021-03-09: qty 250

## 2021-03-09 MED ORDER — NALBUPHINE HCL 10 MG/ML IJ SOLN
5.0000 mg | INTRAMUSCULAR | Status: DC | PRN
Start: 2021-03-09 — End: 2021-03-11

## 2021-03-09 MED ORDER — WITCH HAZEL-GLYCERIN EX PADS: 1.0000 "application " | MEDICATED_PAD | CUTANEOUS | Status: DC | PRN

## 2021-03-09 MED ORDER — SODIUM CHLORIDE 0.9 % IV SOLN: 500.0000 mg | INTRAVENOUS | Status: DC

## 2021-03-09 MED ORDER — FENTANYL CITRATE (PF) 100 MCG/2ML IJ SOLN: 25.0000 ug | INTRAMUSCULAR | Status: DC | PRN

## 2021-03-09 MED ORDER — MORPHINE SULFATE (PF) 0.5 MG/ML IJ SOLN
INTRAMUSCULAR | Status: DC | PRN
  Administered 2021-03-09: .15 mg via INTRATHECAL

## 2021-03-09 MED ORDER — PRENATAL MULTIVITAMIN CH
1.0000 | ORAL_TABLET | Freq: Every day | ORAL | Status: DC
  Administered 2021-03-09 – 2021-03-11 (×3): 1 via ORAL
  Filled 2021-03-09 (×3): qty 1

## 2021-03-09 MED ORDER — SOD CITRATE-CITRIC ACID 500-334 MG/5ML PO SOLN
ORAL | Status: AC
  Filled 2021-03-09: qty 30

## 2021-03-09 MED ORDER — SENNOSIDES-DOCUSATE SODIUM 8.6-50 MG PO TABS
2.0000 | ORAL_TABLET | Freq: Every day | ORAL | Status: DC
  Administered 2021-03-10 – 2021-03-11 (×2): 2 via ORAL
  Filled 2021-03-09 (×2): qty 2

## 2021-03-09 MED ORDER — LACTATED RINGERS IV SOLN: INTRAVENOUS | Status: DC

## 2021-03-09 MED ORDER — ACETAMINOPHEN 500 MG PO TABS
1000.0000 mg | ORAL_TABLET | Freq: Four times a day (QID) | ORAL | Status: AC
  Administered 2021-03-09 (×2): 1000 mg via ORAL
  Filled 2021-03-09 (×4): qty 2

## 2021-03-09 MED ORDER — BUPIVACAINE IN DEXTROSE 0.75-8.25 % IT SOLN
INTRATHECAL | Status: DC | PRN
  Administered 2021-03-09: 1.8 mL via INTRATHECAL

## 2021-03-09 MED ORDER — SIMETHICONE 80 MG PO CHEW
80.0000 mg | CHEWABLE_TABLET | Freq: Three times a day (TID) | ORAL | Status: DC
  Administered 2021-03-09 – 2021-03-11 (×5): 80 mg via ORAL
  Filled 2021-03-09 (×6): qty 1

## 2021-03-09 MED ORDER — NALBUPHINE HCL 10 MG/ML IJ SOLN: 5.0000 mg | Freq: Once | INTRAMUSCULAR | Status: DC | PRN

## 2021-03-09 MED ORDER — OXYTOCIN-SODIUM CHLORIDE 30-0.9 UT/500ML-% IV SOLN
INTRAVENOUS | Status: DC | PRN
  Administered 2021-03-09: 300 mL via INTRAVENOUS

## 2021-03-09 MED ORDER — DIPHENHYDRAMINE HCL 50 MG/ML IJ SOLN: 12.5000 mg | INTRAMUSCULAR | Status: DC | PRN

## 2021-03-09 MED ORDER — MENTHOL 3 MG MT LOZG: 1.0000 | LOZENGE | OROMUCOSAL | Status: DC | PRN

## 2021-03-09 MED ORDER — ACETAMINOPHEN 10 MG/ML IV SOLN
INTRAVENOUS | Status: AC
  Filled 2021-03-09: qty 100

## 2021-03-09 MED ORDER — DEXAMETHASONE SODIUM PHOSPHATE 4 MG/ML IJ SOLN
INTRAMUSCULAR | Status: AC
  Filled 2021-03-09: qty 1

## 2021-03-09 MED ORDER — ACETAMINOPHEN 10 MG/ML IV SOLN
INTRAVENOUS | Status: DC | PRN
  Administered 2021-03-09: 1000 mg via INTRAVENOUS

## 2021-03-09 MED ORDER — SOD CITRATE-CITRIC ACID 500-334 MG/5ML PO SOLN: 30.0000 mL | ORAL | Status: AC

## 2021-03-09 MED ORDER — DEXAMETHASONE SODIUM PHOSPHATE 4 MG/ML IJ SOLN
INTRAMUSCULAR | Status: DC | PRN
  Administered 2021-03-09: 4 mg via INTRAVENOUS

## 2021-03-09 MED ORDER — CEFAZOLIN SODIUM-DEXTROSE 2-4 GM/100ML-% IV SOLN: 2.0000 g | INTRAVENOUS | Status: DC

## 2021-03-09 MED ORDER — NALOXONE HCL 4 MG/10ML IJ SOLN
1.0000 ug/kg/h | INTRAVENOUS | Status: DC | PRN
  Filled 2021-03-09: qty 5

## 2021-03-09 MED ORDER — FENTANYL CITRATE (PF) 100 MCG/2ML IJ SOLN
INTRAMUSCULAR | Status: DC | PRN
  Administered 2021-03-09: 15 ug via INTRATHECAL

## 2021-03-09 MED ORDER — ONDANSETRON HCL 4 MG/2ML IJ SOLN
INTRAMUSCULAR | Status: DC | PRN
  Administered 2021-03-09: 4 mg via INTRAVENOUS

## 2021-03-09 MED ORDER — POVIDONE-IODINE 10 % EX SWAB: 2.0000 "application " | Freq: Once | CUTANEOUS | Status: AC

## 2021-03-09 MED ORDER — CEFAZOLIN SODIUM-DEXTROSE 2-3 GM-%(50ML) IV SOLR
INTRAVENOUS | Status: DC | PRN
  Administered 2021-03-09: 2 g via INTRAVENOUS

## 2021-03-09 MED ORDER — COCONUT OIL OIL: 1.0000 "application " | TOPICAL_OIL | Status: DC | PRN

## 2021-03-09 MED ORDER — KETOROLAC TROMETHAMINE 30 MG/ML IJ SOLN
30.0000 mg | Freq: Four times a day (QID) | INTRAMUSCULAR | Status: AC | PRN
  Administered 2021-03-09 (×2): 30 mg via INTRAVENOUS
  Filled 2021-03-09: qty 1

## 2021-03-09 MED ORDER — KETOROLAC TROMETHAMINE 30 MG/ML IJ SOLN: 30.0000 mg | Freq: Four times a day (QID) | INTRAMUSCULAR | Status: AC | PRN

## 2021-03-09 MED ORDER — SODIUM CHLORIDE 0.9% FLUSH: 3.0000 mL | INTRAVENOUS | Status: DC | PRN

## 2021-03-09 MED ORDER — FENTANYL CITRATE (PF) 100 MCG/2ML IJ SOLN
INTRAMUSCULAR | Status: AC
  Filled 2021-03-09: qty 2

## 2021-03-09 MED ORDER — OXYTOCIN-SODIUM CHLORIDE 30-0.9 UT/500ML-% IV SOLN: 2.5000 [IU]/h | INTRAVENOUS | Status: AC

## 2021-03-09 MED ORDER — SIMETHICONE 80 MG PO CHEW: 80.0000 mg | CHEWABLE_TABLET | ORAL | Status: DC | PRN

## 2021-03-09 MED ORDER — SODIUM CHLORIDE 0.9 % IV SOLN
INTRAVENOUS | Status: DC | PRN
  Administered 2021-03-09: 500 mg via INTRAVENOUS

## 2021-03-09 MED ORDER — DIPHENHYDRAMINE HCL 25 MG PO CAPS: 25.0000 mg | ORAL_CAPSULE | Freq: Four times a day (QID) | ORAL | Status: DC | PRN

## 2021-03-09 MED ORDER — NALOXONE HCL 0.4 MG/ML IJ SOLN: 0.4000 mg | INTRAMUSCULAR | Status: DC | PRN

## 2021-03-09 MED ORDER — CEFAZOLIN SODIUM-DEXTROSE 2-4 GM/100ML-% IV SOLN
INTRAVENOUS | Status: AC
  Filled 2021-03-09: qty 100

## 2021-03-09 MED ORDER — STERILE WATER FOR IRRIGATION IR SOLN
Status: DC | PRN
  Administered 2021-03-09: 1000 mL

## 2021-03-09 MED ORDER — ONDANSETRON HCL 4 MG/2ML IJ SOLN
INTRAMUSCULAR | Status: AC
  Filled 2021-03-09: qty 2

## 2021-03-09 MED ORDER — DIPHENHYDRAMINE HCL 25 MG PO CAPS: 25.0000 mg | ORAL_CAPSULE | ORAL | Status: DC | PRN

## 2021-03-09 MED ORDER — SCOPOLAMINE 1 MG/3DAYS TD PT72: 1.0000 | MEDICATED_PATCH | Freq: Once | TRANSDERMAL | Status: DC

## 2021-03-09 MED ORDER — MORPHINE SULFATE (PF) 0.5 MG/ML IJ SOLN
INTRAMUSCULAR | Status: AC
  Filled 2021-03-09: qty 10

## 2021-03-09 MED ORDER — TETANUS-DIPHTH-ACELL PERTUSSIS 5-2.5-18.5 LF-MCG/0.5 IM SUSY
0.5000 mL | PREFILLED_SYRINGE | Freq: Once | INTRAMUSCULAR | Status: AC
  Administered 2021-03-10: 0.5 mL via INTRAMUSCULAR
  Filled 2021-03-09: qty 0.5

## 2021-03-09 MED ORDER — SODIUM CHLORIDE 0.9 % IV SOLN
INTRAVENOUS | Status: AC
  Filled 2021-03-09: qty 500

## 2021-03-09 MED ORDER — PHENYLEPHRINE HCL-NACL 20-0.9 MG/250ML-% IV SOLN
INTRAVENOUS | Status: DC | PRN
  Administered 2021-03-09: 60 ug/min via INTRAVENOUS

## 2021-03-09 MED ORDER — ONDANSETRON HCL 4 MG/2ML IJ SOLN: 4.0000 mg | Freq: Three times a day (TID) | INTRAMUSCULAR | Status: DC | PRN

## 2021-03-09 MED ORDER — OXYTOCIN-SODIUM CHLORIDE 30-0.9 UT/500ML-% IV SOLN
INTRAVENOUS | Status: AC
  Filled 2021-03-09: qty 500

## 2021-03-09 MED ORDER — ENOXAPARIN SODIUM 60 MG/0.6ML IJ SOSY
50.0000 mg | PREFILLED_SYRINGE | INTRAMUSCULAR | Status: DC
  Administered 2021-03-10 – 2021-03-11 (×2): 50 mg via SUBCUTANEOUS
  Filled 2021-03-09 (×2): qty 0.6

## 2021-03-09 MED ORDER — SODIUM CHLORIDE 0.9 % IR SOLN
Status: DC | PRN
  Administered 2021-03-09: 1

## 2021-03-09 MED ORDER — ZOLPIDEM TARTRATE 5 MG PO TABS: 5.0000 mg | ORAL_TABLET | Freq: Every evening | ORAL | Status: DC | PRN

## 2021-03-09 MED ORDER — ACETAMINOPHEN 10 MG/ML IV SOLN: 1000.0000 mg | Freq: Once | INTRAVENOUS | Status: DC | PRN

## 2021-03-09 MED ORDER — LACTATED RINGERS IV SOLN
INTRAVENOUS | Status: DC | PRN
Start: 2021-03-09 — End: 2021-03-09

## 2021-03-09 MED ORDER — DIBUCAINE (PERIANAL) 1 % EX OINT: 1.0000 "application " | TOPICAL_OINTMENT | CUTANEOUS | Status: DC | PRN

## 2021-03-09 MED ORDER — KETOROLAC TROMETHAMINE 30 MG/ML IJ SOLN
INTRAMUSCULAR | Status: AC
  Filled 2021-03-09: qty 1

## 2021-03-09 SURGICAL SUPPLY — 32 items
BARRIER ADHS 3X4 INTERCEED (GAUZE/BANDAGES/DRESSINGS) IMPLANT
BENZOIN TINCTURE PRP APPL 2/3 (GAUZE/BANDAGES/DRESSINGS) ×2 IMPLANT
CHLORAPREP W/TINT 26ML (MISCELLANEOUS) ×2 IMPLANT
CLAMP CORD UMBIL (MISCELLANEOUS) ×2 IMPLANT
CLOTH BEACON ORANGE TIMEOUT ST (SAFETY) ×2 IMPLANT
DRSG OPSITE POSTOP 4X10 (GAUZE/BANDAGES/DRESSINGS) ×2 IMPLANT
DRSG PAD ABDOMINAL 8X10 ST (GAUZE/BANDAGES/DRESSINGS) ×2 IMPLANT
ELECT REM PT RETURN 9FT ADLT (ELECTROSURGICAL) ×2
ELECTRODE REM PT RTRN 9FT ADLT (ELECTROSURGICAL) ×1 IMPLANT
EXTRACTOR VACUUM KIWI (MISCELLANEOUS) IMPLANT
GLOVE BIO SURGEON STRL SZ 6.5 (GLOVE) ×2 IMPLANT
GLOVE BIOGEL PI IND STRL 7.0 (GLOVE) ×2 IMPLANT
GLOVE BIOGEL PI INDICATOR 7.0 (GLOVE) ×2
GOWN STRL REUS W/TWL LRG LVL3 (GOWN DISPOSABLE) ×4 IMPLANT
KIT ABG SYR 3ML LUER SLIP (SYRINGE) IMPLANT
NEEDLE HYPO 22GX1.5 SAFETY (NEEDLE) IMPLANT
NEEDLE HYPO 25X5/8 SAFETYGLIDE (NEEDLE) IMPLANT
NS IRRIG 1000ML POUR BTL (IV SOLUTION) ×2 IMPLANT
PACK C SECTION WH (CUSTOM PROCEDURE TRAY) ×2 IMPLANT
PAD OB MATERNITY 4.3X12.25 (PERSONAL CARE ITEMS) ×2 IMPLANT
PENCIL SMOKE EVAC W/HOLSTER (ELECTROSURGICAL) ×2 IMPLANT
RETRACTOR WND ALEXIS 25 LRG (MISCELLANEOUS) IMPLANT
RTRCTR WOUND ALEXIS 25CM LRG (MISCELLANEOUS)
STRIP CLOSURE SKIN 1/2X4 (GAUZE/BANDAGES/DRESSINGS) ×2 IMPLANT
SUT VIC AB 0 CT1 36 (SUTURE) ×12 IMPLANT
SUT VIC AB 2-0 CT1 27 (SUTURE) ×1
SUT VIC AB 2-0 CT1 TAPERPNT 27 (SUTURE) ×1 IMPLANT
SUT VIC AB 4-0 PS2 27 (SUTURE) ×2 IMPLANT
SYR CONTROL 10ML LL (SYRINGE) IMPLANT
TOWEL OR 17X24 6PK STRL BLUE (TOWEL DISPOSABLE) ×2 IMPLANT
TRAY FOLEY W/BAG SLVR 14FR LF (SET/KITS/TRAYS/PACK) IMPLANT
WATER STERILE IRR 1000ML POUR (IV SOLUTION) ×2 IMPLANT

## 2021-03-09 NOTE — Progress Notes (Signed)
Cervix 5-6 cm breech with vaginal bleeding and contractions, counseled to proceed with cesarean section for breech with twins  The risks of surgery were discussed with the patient including but were not limited to: bleeding which may require transfusion or reoperation; infection which may require antibiotics; injury to bowel, bladder, ureters or other surrounding organs; injury to the fetus; need for additional procedures including hysterectomy in the event of a life-threatening hemorrhage; formation of adhesions; placental abnormalities with subsequent pregnancies; incisional problems; thromboembolic phenomenon and other postoperative/anesthesia complications.  The patient concurred with the proposed plan, giving informed written consent for the procedure.   Patient has been NPO since 2100 she will remain NPO for procedure. Anesthesia and OR aware. Preoperative prophylactic antibiotics and SCDs ordered on call to the OR.  To OR when ready.   Adam Phenix, MD 03/09/2021 4:07 AM   Patient ID: Amy Prince, female   DOB: Apr 08, 1996, 25 y.o.   MRN: [id number]

## 2021-03-09 NOTE — Lactation Note (Signed)
This note was copied from a baby's chart. Lactation Consultation Note LC provided initial education and assisted with initiation of pumping. Mother recalls successfully bf'ing 1st child (12 years ago) but may be at risk for low milk supply because of hypoplastic breasts and history of low milk supply 2 years ago.  Mother will need [facility] loaner pump. This [facility] faxed referral.  Patient Name: Amy Prince Today's Date: 03/09/2021 Reason for consult: NICU baby;Initial assessment Age:59 hours  Maternal Data Has patient been taught Hand Expression?: Yes Does the patient have breastfeeding experience prior to this delivery?: Yes How long did the patient breastfeed?: 2 years with 1st child. Second child was bf for 2 weeks. Wide spaced breasts  History of low milk supply (2nd child)   Feeding Mother's Current Feeding Choice: Breast Milk  Lactation Tools Discussed/Used Tools: Pump Breast pump type: Double-Electric Breast Pump Pump Education: Setup, frequency, and cleaning;Milk Storage Reason for Pumping: NICU infant  Interventions Interventions: Education  Discharge [facility] Program: Yes  Consult Status Consult Status: Follow-up Date: 03/09/21 Follow-up type: In-patient   Elder Negus 03/09/2021, 2:30 PM

## 2021-03-09 NOTE — Op Note (Signed)
Tommy Rainwater PROCEDURE DATE: 03/09/2021  PREOPERATIVE DIAGNOSES: Intrauterine pregnancy at [redacted]w[redacted]d weeks gestation; ruptured membranes with preterm labor and bleeding, malpresentation: breech twin B and fetal hydrops twin A Maternal obesity POSTOPERATIVE DIAGNOSES: Fetal demise twin A with anomalies, breech twin B  PROCEDURE: Low Transverse Cesarean Section  SURGEON:  Adam Phenix, MD  ASSISTANT:  none  ANESTHESIOLOGY TEAM: Anesthesiologist: Elmer Picker, MD CRNA: Trellis Paganini, CRNA  INDICATIONS: Amy Prince is a 25 y.o. [phone] at [redacted]w[redacted]d here for cesarean section secondary to the indications listed under preoperative diagnoses; please see preoperative note for further details.  The risks of surgery were discussed with the patient including but were not limited to: bleeding which may require transfusion or reoperation; infection which may require antibiotics; injury to bowel, bladder, ureters or other surrounding organs; injury to the fetus; need for additional procedures including hysterectomy in the event of a life-threatening hemorrhage; formation of adhesions; placental abnormalities wth subsequent pregnancies; incisional problems; thromboembolic phenomenon and other postoperative/anesthesia complications.  The patient concurred with the proposed plan, giving informed written consent for the procedure.    FINDINGS:  Fetal demise twin A cephalic, viable twin B breech   Apgars 2 and 6 and 7.  Clear amniotic fluid.  Intact placenta, three vessel cord.  Normal uterus, fallopian tubes and ovaries bilaterally.  ANESTHESIA: Spinal  INTRAVENOUS FLUIDS: 1850 ml   ESTIMATED BLOOD LOSS: 519 ml URINE OUTPUT:  500 ml SPECIMENS: Placenta sent to pathology COMPLICATIONS: None immediate  PROCEDURE IN DETAIL:  The patient preoperatively received intravenous antibiotics and had sequential compression devices applied to her lower extremities.  She was then taken to the  operating room where spinal anesthesia was administered and was found to be adequate. She was then placed in a dorsal supine position with a leftward tilt, and prepped and draped in a sterile manner.  A foley catheter was placed into her bladder and attached to constant gravity.  After an adequate timeout was performed, a Pfannenstiel skin incision was made with scalpel and carried through to the underlying layer of fascia. The fascia was incised in the midline, and this incision was extended bilaterally using the Mayo scissors.  Kocher clamps were applied to the superior aspect of the fascial incision and the underlying rectus muscles were dissected off bluntly and sharply.  A similar process was carried out on the inferior aspect of the fascial incision. The rectus muscles were separated in the midline and the peritoneum was entered bluntly. The Alexis self-retaining retractor was introduced into the abdominal cavity.  Attention was turned to the lower uterine segment where a low transverse hysterotomy was made with a scalpel and extended bilaterally bluntly.  Twin A was hydropic, non-viable and macerated, cephalic. The infant was successfully delivered, the cord was clamped and cut and the infant was handed over to the awaiting neonatology team. Twin B sac was ruptured and infant was delivered breech with loose nuchal cord Uterine massage was then administered, and the placenta delivered intact with a three-vessel cord. The uterus was then cleared of clots and debris.  The hysterotomy was closed with 0 Vicryl in a running locked fashion, and an imbricating layer was also placed with 0 Vicryl. The pelvis was cleared of all clot and debris. Hemostasis was confirmed on all surfaces.  The retractor was removed.  The peritoneum was closed with a 0 Vicryl running stitch The fascia was then closed using 0 Vicryl.  The subcutaneous layer was irrigated, reapproximated with  2-0 plain gut interrupted stitches, and the skin  was closed with a 4-0 Vicryl subcuticular stitch. The patient tolerated the procedure well. Sponge, instrument and needle counts were correct x 3.  She was taken to the recovery room in stable condition.    Adam Phenix, MD, FACOG Obstetrician & Gynecologist, [facility] for [facility], [facility] 03/09/2021

## 2021-03-09 NOTE — Anesthesia Preprocedure Evaluation (Signed)
Anesthesia Evaluation  Patient identified by MRN, date of birth, ID band Patient awake    Reviewed: Allergy & Precautions, NPO status , Patient's Chart, lab work & pertinent test results  Airway Mallampati: III  TM Distance: >3 FB Neck ROM: Full    Dental no notable dental hx.    Pulmonary neg pulmonary ROS,    Pulmonary exam normal breath sounds clear to auscultation       Cardiovascular negative cardio ROS Normal cardiovascular exam Rhythm:Regular Rate:Normal     Neuro/Psych negative neurological ROS  negative psych ROS   GI/Hepatic negative GI ROS, Neg liver ROS,   Endo/Other  Morbid obesity (BMI 42)  Renal/GU negative Renal ROS  negative genitourinary   Musculoskeletal negative musculoskeletal ROS (+)   Abdominal   Peds  Hematology negative hematology ROS (+)   Anesthesia Other Findings [id number] at [redacted]w[redacted]d admitted for PPROM of Twin A with di/di twins now with significant cervical change, vaginal bleeding, and contractions. Presents to OR for C/S.    Reproductive/Obstetrics (+) Pregnancy                             Anesthesia Physical Anesthesia Plan  ASA: 3 and emergent  Anesthesia Plan: Spinal   Post-op Pain Management:    Induction:   PONV Risk Score and Plan: Treatment may vary due to age or medical condition  Airway Management Planned: Natural Airway  Additional Equipment:   Intra-op Plan:   Post-operative Plan:   Informed Consent: I have reviewed the patients History and Physical, chart, labs and discussed the procedure including the risks, benefits and alternatives for the proposed anesthesia with the patient or authorized representative who has indicated his/her understanding and acceptance.     Dental advisory given  Plan Discussed with: CRNA  Anesthesia Plan Comments:         Anesthesia Quick Evaluation

## 2021-03-09 NOTE — Transfer of Care (Signed)
Immediate Anesthesia Transfer of Care Note  Patient: Amy Prince  Procedure(s) Performed: CESAREAN SECTION (Abdomen)  Patient Location: PACU  Anesthesia Type:Spinal  Level of Consciousness: awake, alert  and patient cooperative  Airway & Oxygen Therapy: Patient Spontanous Breathing  Post-op Assessment: Report given to RN and Post -op Vital signs reviewed and stable  Post vital signs: Reviewed and stable  Last Vitals:  Vitals Value Taken Time  BP    Temp    Pulse 65 03/09/21 0618  Resp 21 03/09/21 0618  SpO2 99 % 03/09/21 0618  Vitals shown include unvalidated device data.  Last Pain:  Vitals:   03/09/21 0400  TempSrc:   PainSc: 6          Complications: No notable events documented.

## 2021-03-09 NOTE — Discharge Summary (Signed)
   Postpartum Discharge Summary  Date of Service updated: 03/11/21     Patient Name: Amy Prince DOB: 08/25/1995 MRN: [id number]  Date of admission: 03/05/2021 Delivery date: 03/09/21   [name], [name] Batul [[id number]]  03/09/2021    [name], [name] Reniya [[id number]]  03/09/2021  Delivering provider:    [name], [name] Khrystina [[id number]]  ARNOLD, JAMES G    [name], [name] Lizett [[id number]]  ARNOLD, JAMES G  Date of discharge: 03/11/2021  Admitting diagnosis: Preterm premature rupture of membranes (PPROM) with unknown onset of labor [O42.919] Intrauterine pregnancy: [redacted]w[redacted]d     Secondary diagnosis:  Principal Problem:   Preterm premature rupture of membranes (PPROM)  Active Problems:   [redacted] weeks gestation of pregnancy  Additional problems: fetal demise twin A    Discharge diagnosis: Preterm Pregnancy Delivered and fetal malpresentation with demise of twin A                                               Post partum procedures: n/a Augmentation: N/A Complications: Stillborn  Hospital course: Onset of Labor With Unplanned C/S   25 y.o. yo G4P1112 at [redacted]w[redacted]d was admitted with premature rupture of membranes on 03/05/2021. Twin A was known to have hydrops and anomalies and was not to be monitored or resuscitated. Twin B was breech. She received betamethasone and magnesium for 48 hours and antibiotics.Patient had a labor course significant for onset of bleeding and preterm labor. The patient went for cesarean section due to Malpresentation and vaginal bleeding . Delivery details as follows: Membrane Rupture Time/Date:    [name], [name] [[id number]]  4:58 AM    [name], [name] [[id number]]  6:00 AM ,   [name], [name] [[id number]]  03/09/2021    [name], [name] [[id number]]  03/05/2021   Delivery Method:   [name], [name] [[id number]]  C-Section, Low Transverse     [name], [name] [[id number]]  C-Section, Low Transverse  Details of operation can be found in separate operative note. Patient had an uncomplicated postpartum course.  She is ambulating,tolerating a regular diet, passing flatus, and urinating well.  Patient is discharged home in stable condition 03/11/21.  Newborn Data: Birth date:   [name], [name] [[id number]]  03/09/2021    [name], [name] [[id number]]  03/09/2021  Birth time:   [name], [name] [[id number]]  5:00 AM    [name], [name] [[id number]]  5:01 AM  Gender:   [name], [name] [[id number]]  Female    [name], [name] [[id number]]  Female  Living status:   [name], [name] [[id number]]  Fetal Demise    [name], [name] [[id number]]  Living  Apgars:   [name], [name] [[id number]]      [name], [name] [[id number]]  2 ,   [name], [name] [[id number]]      [name], [name] [[id number]]  6  Weight:   [name], [name] [[id number]]      [name], [name] [[id number]]  1130 g   Magnesium Sulfate received: Yes: Neuroprotection BMZ received: Yes Rhophylac:No MMR:No T-DaP: n/a Flu: N/A Transfusion:No  Physical exam  Vitals:   03/11/21 0332 03/11/21 0804 03/11/21 0805 03/11/21 1146  BP: 103/65  123/79 133/64  Pulse: 81    94 97  Resp: 16  16 16  Temp: 99.2 F (37.3 C)  99.5 F (37.5 C) 99.1 F (37.3 C)  TempSrc: Oral  Oral Oral  SpO2: 98% 99% 96% 100%  Weight:      Height:       General: alert, cooperative, and no distress Lochia: appropriate Uterine Fundus: firm Incision: Healing well with no significant drainage, Dressing is clean, dry, and intact DVT Evaluation: No evidence of DVT seen on physical exam. Labs: Lab Results  Component Value Date   WBC 10.0 03/11/2021   HGB 9.5 (L)  03/11/2021   HCT 28.7 (L) 03/11/2021   MCV 90.8 03/11/2021   PLT 253 03/11/2021   CMP Latest Ref Rng & Units 03/09/2021  Glucose 70 - 99 mg/dL -  BUN 6 - 20 mg/dL -  Creatinine 0.44 - 1.00 mg/dL 0.42(L)  Sodium 135 - 145 mmol/L -  Potassium 3.5 - 5.1 mmol/L -  Chloride 98 - 111 mmol/L -  CO2 22 - 32 mmol/L -  Calcium 8.9 - 10.3 mg/dL -  Total Protein 6.5 - 8.1 g/dL -  Total Bilirubin 0.3 - 1.2 mg/dL -  Alkaline Phos 38 - 126 U/L -  AST 15 - 41 U/L -  ALT 0 - 44 U/L -   Edinburgh Score: Edinburgh Postnatal Depression Scale Screening Tool 03/10/2021  I have been able to laugh and see the funny side of things. 0  I have looked forward with enjoyment to things. 0  I have blamed myself unnecessarily when things went wrong. 0  I have been anxious or worried for no good reason. 0  I have felt scared or panicky for no good reason. 0  Things have been getting on top of me. 0  I have been so unhappy that I have had difficulty sleeping. 0  I have felt sad or miserable. 0  I have been so unhappy that I have been crying. 0  The thought of harming myself has occurred to me. 0  Edinburgh Postnatal Depression Scale Total 0     After visit meds:  Allergies as of 03/11/2021   No Known Allergies      Medication List     STOP taking these medications    aspirin 81 MG chewable tablet       TAKE these medications    ibuprofen 600 MG tablet Commonly known as: ADVIL Take 1 tablet (600 mg total) by mouth every 6 (six) hours as needed for headache, mild pain, moderate pain or cramping.   oxyCODONE 5 MG immediate release tablet Commonly known as: Oxy IR/ROXICODONE Take 1 tablet (5 mg total) by mouth every 6 (six) hours as needed for up to 7 days for severe pain or breakthrough pain.   prenatal multivitamin Tabs tablet Take 1 tablet by mouth daily at 12 noon.         Discharge home in stable condition Infant Feeding: Breast Infant Disposition:NICU Discharge instruction: per  After Visit Summary and Postpartum booklet. Activity: Advance as tolerated. Pelvic rest for 6 weeks.  Diet: routine diet Future Appointments:No future appointments. Follow up Visit:  Follow-up Information     [facility] Schedule an appointment as soon as possible for a visit in 1 week(s).   Specialty: Obstetrics and Gynecology Why: wound check in 1 week, postpartum with potential IUD placement in 5 weeks Contact information: [address] [place] [address] [phone]                   Please schedule this patient for a In person postpartum visit in  5 weeks  with the following provider: MD. Additional Postpartum F/U:Incision check 1 week  High risk pregnancy complicated by:  twin gestation and demise of twin A Delivery mode:     [name], [name] Jameeka [[id number]]  C-Section, Low Transverse    [name], [name] Linn [[id number]]  C-Section, Low Transverse  Anticipated Birth Control:  IUD   03/11/2021 Lawrence A Bass, MD    

## 2021-03-09 NOTE — Progress Notes (Signed)
Pt is anitbody positive for toxoplasma and parvovirus. Relayed to L&D nurse.

## 2021-03-09 NOTE — Progress Notes (Signed)
   03/09/21 1000  Clinical Encounter Type  Visited With Patient and family together;Health care provider  Visit Type Initial;Psychological support;Social support;Death  Referral From Nurse  Consult/Referral To Chaplain  Spiritual Encounters  Spiritual Needs Emotional  Stress Factors  Patient Stress Factors Loss;Major life changes    CH received page to provide support to pt. and her husband in the wake of pt.'s emergency c-section this AM and the fetal demise of one of their newborn twin girls.  Pt. lying in bed w/husband in recliner at window; [facility] made visit via video interpreter and conveyed congratulations for the birth of their daughter Wyn Forster, as well as offering condolences for the loss of their daughter Tilden Fossa.  Pt. and husband requested information re: costs charged by their chosen funeral home ([employer] in [place]); [facility] called funeral home and relayed this info to pt.  No further needs expressed at this time.  Chaplain Marchelle Folks is aware of pt.'s situation and potential need for follow-up support.  Elpidio Anis, Chaplain Pager: [phone]

## 2021-03-09 NOTE — Anesthesia Procedure Notes (Signed)
Spinal  Patient location during procedure: OR Start time: 03/09/2021 4:25 AM End time: 03/09/2021 4:29 AM Reason for block: surgical anesthesia Staffing Performed: anesthesiologist  Anesthesiologist: Elmer Picker, MD Preanesthetic Checklist Completed: patient identified, IV checked, risks and benefits discussed, surgical consent, monitors and equipment checked, pre-op evaluation and timeout performed Spinal Block Patient position: sitting Prep: DuraPrep and site prepped and draped Patient monitoring: cardiac monitor, continuous pulse ox and blood pressure Approach: midline Location: L3-4 Injection technique: single-shot Needle Needle type: Pencan  Needle gauge: 24 G Needle length: 9 cm Assessment Sensory level: T6 Events: CSF return Additional Notes Functioning IV was confirmed and monitors were applied. Sterile prep and drape, including hand hygiene and sterile gloves were used. The patient was positioned and the spine was prepped. The skin was anesthetized with lidocaine.  Free flow of clear CSF was obtained prior to injecting local anesthetic into the CSF.  The spinal needle aspirated freely following injection.  The needle was carefully withdrawn.  The patient tolerated the procedure well.

## 2021-03-10 LAB — TYPE AND SCREEN
ABO/RH(D): O POS
Antibody Screen: POSITIVE
Donor AG Type: NEGATIVE
Donor AG Type: NEGATIVE
Donor AG Type: NEGATIVE
Unit division: 0
Unit division: 0
Unit division: 0

## 2021-03-10 LAB — RPR: RPR Ser Ql: NONREACTIVE

## 2021-03-10 LAB — BPAM RBC
Blood Product Expiration Date: 202209262359
Blood Product Expiration Date: 202210202359
Blood Product Expiration Date: 202210202359
Unit Type and Rh: 5100
Unit Type and Rh: 5100
Unit Type and Rh: 5100

## 2021-03-10 LAB — CBC
HCT: 28.7 % — ABNORMAL LOW (ref 36.0–46.0)
Hemoglobin: 9.9 g/dL — ABNORMAL LOW (ref 12.0–15.0)
MCH: 30.8 pg (ref 26.0–34.0)
MCHC: 34.5 g/dL (ref 30.0–36.0)
MCV: 89.4 fL (ref 80.0–100.0)
Platelets: 230 10*3/uL (ref 150–400)
RBC: 3.21 MIL/uL — ABNORMAL LOW (ref 3.87–5.11)
RDW: 13.3 % (ref 11.5–15.5)
WBC: 10 10*3/uL (ref 4.0–10.5)
nRBC: 0.2 % (ref 0.0–0.2)

## 2021-03-10 LAB — SURGICAL PATHOLOGY

## 2021-03-10 NOTE — Lactation Note (Signed)
This note was copied from a baby's chart. Lactation Consultation Note  Patient Name: Amy Prince Today's Date: 03/10/2021 Reason for consult: Follow-up assessment;Multiple gestation;NICU baby;Term;Infant < 6lbs;Other (Comment) (hypoplastic breasts) Age:27 hours  Visited with mom of  63 37/82 weeks old (adjusted) NICU twin "B" female, twin "A" was an IUFD.  Assisted mom with hand expression, colostrum was easily obtained. However, mom repots a Hx of low milk supply, she also has hypoplastic breasts.   LC and LC student Zack Seal assisted mom with washing her pump parts and also pumping, she hasn't pumped the last 5 hours. Explained to mom the importance of consistent pumping to protect her supply, reviewed benefits of breastmilk for NICU babies, pumping schedule, supply/demand.  Plan of care:  Encouraged mom to pump every 2-3 hours, at least 8 pumping sessions/24 hours She'll be discharged tomorrow, she was offered a [facility] loaner but said she'll most likely call the [facility] office today to get an appt for tomorrow to P/U her pump  No other support person at this point. All questions and concerns were answered, mom aware of NICU LC services and will call PRN.  Maternal Data   Mom's milk supply is WNL  Feeding Mother's Current Feeding Choice: Breast Milk  Lactation Tools Discussed/Used Tools: Pump;Flanges Flange Size: 24 Breast pump type: Double-Electric Breast Pump Pump Education: Setup, frequency, and cleaning;Milk Storage Reason for Pumping: pre-term infant in NICU Pumped volume:  (drops)  Interventions Interventions: Breast feeding basics reviewed;DEBP;Education;Hand express;Breast massage  Discharge Discharge Education: Engorgement and breast care Pump: DEBP  Consult Status Consult Status: Follow-up Date: 03/10/21 Follow-up type: In-patient  [name] Venetia Constable 03/10/2021, 4:07 PM

## 2021-03-10 NOTE — Progress Notes (Signed)
POSTPARTUM PROGRESS NOTE  POD #1  Subjective:  Amy Prince is a 25 y.o. [phone] s/p primary LTCS at [redacted]w[redacted]d.  She reports she doing well. No acute events overnight. . She denies any problems with ambulating, voiding or po intake. Denies nausea or vomiting. She has  passed flatus. Pain is well controlled.  Lochia is appropriate.  Objective: Blood pressure 106/60, pulse 90, temperature 98.5 F (36.9 C), temperature source Oral, resp. rate 17, height 5\' 2"  (1.575 m), weight 104.3 kg, last menstrual period 08/19/2020, SpO2 97 %, unknown if currently breastfeeding.  Physical Exam:  General: alert, cooperative and no distress Chest: no respiratory distress Heart:regular rate, distal pulses intact Abdomen: soft, nontender,  Uterine Fundus: firm, appropriately tender DVT Evaluation: No calf swelling or tenderness Extremities: minimal edema Skin: warm, dry; incision clean/dry/intact w/ honeycomb dressing in place, external bandage in place  Recent Labs    03/09/21 0844 03/10/21 0438  HGB 11.1* 9.9*  HCT 33.0* 28.7*    Assessment/Plan: Amy Prince is a 25 y.o. [phone] s/p primary LTCS at [redacted]w[redacted]d for preterm labor, fetal malpresentation and demise of twin A.  POD#1 -  Contraception: questionable interval tubal Continue routine postpartum care   LOS: 5 days   [redacted]w[redacted]d, Md Faculty Attending, [facility] 03/10/2021, 3:23 PM

## 2021-03-11 ENCOUNTER — Encounter (HOSPITAL_COMMUNITY): Payer: Self-pay | Admitting: Obstetrics & Gynecology

## 2021-03-11 ENCOUNTER — Other Ambulatory Visit (HOSPITAL_COMMUNITY): Payer: Self-pay

## 2021-03-11 LAB — CBC
HCT: 28.7 % — ABNORMAL LOW (ref 36.0–46.0)
Hemoglobin: 9.5 g/dL — ABNORMAL LOW (ref 12.0–15.0)
MCH: 30.1 pg (ref 26.0–34.0)
MCHC: 33.1 g/dL (ref 30.0–36.0)
MCV: 90.8 fL (ref 80.0–100.0)
Platelets: 253 10*3/uL (ref 150–400)
RBC: 3.16 MIL/uL — ABNORMAL LOW (ref 3.87–5.11)
RDW: 13.5 % (ref 11.5–15.5)
WBC: 10 10*3/uL (ref 4.0–10.5)
nRBC: 0 % (ref 0.0–0.2)

## 2021-03-11 MED ORDER — IBUPROFEN 600 MG PO TABS
600.0000 mg | ORAL_TABLET | Freq: Four times a day (QID) | ORAL | 2 refills | Status: AC | PRN
  Filled 2021-03-11: qty 30, 8d supply, fill #0

## 2021-03-11 MED ORDER — OXYCODONE HCL 5 MG PO TABS
5.0000 mg | ORAL_TABLET | Freq: Four times a day (QID) | ORAL | 0 refills | Status: DC | PRN
  Filled 2021-03-11: qty 28, 7d supply, fill #0

## 2021-03-11 NOTE — Progress Notes (Signed)
POSTPARTUM PROGRESS NOTE  POD #2  Subjective:  Amy Prince is a 25 y.o. [phone] s/p primary LTCS at [redacted]w[redacted]d.  She reports she doing well. No acute events overnight.  She denies any problems with ambulating, voiding or po intake. Denies nausea or vomiting. She has  passed flatus. Pain is well controlled.  Lochia is minimal.  Objective: Blood pressure 123/79, pulse 94, temperature 99.5 F (37.5 C), temperature source Oral, resp. rate 16, height 5\' 2"  (1.575 m), weight 104.3 kg, last menstrual period 08/19/2020, SpO2 96 %, unknown if currently breastfeeding.  Physical Exam:  General: alert, cooperative and no distress Chest: no respiratory distress Heart:regular rate, distal pulses intact Abdomen: soft, nontender,  Uterine Fundus: firm, appropriately tender DVT Evaluation: No calf swelling or tenderness Extremities: trace edema Skin: warm, dry; incision clean/dry/intact w/ honeycomb dressing in place, no excessive drainage noted  Recent Labs    03/10/21 0438 03/11/21 0507  HGB 9.9* 9.5*  HCT 28.7* 28.7*    Assessment/Plan: Amy Prince is a 25 y.o. [phone] s/p primary LTCS at [redacted]w[redacted]d for preterm labor, fetal malpresentation, demise of twin A.  POD#2 -  Contraception: desires "ten year" IUD  Feeding: breastfeeding  Dispo: Plan for discharge on 03/11/21.   LOS: 6 days   03/13/21, Md Faculty Attending, [facility] 03/11/2021, 12:17 PM

## 2021-03-11 NOTE — Clinical Social Work Maternal (Signed)
CLINICAL SOCIAL WORK MATERNAL/CHILD NOTE  Patient Details  Name: Amy Prince MRN: [id number] Date of Birth: 06/10/1996  Date:  03/11/2021  Clinical Social Worker Initiating Note:  Izel Hochberg, LCSW Date/Time: Initiated:  03/10/21/1621     Child's Name:  Amy Prince   Biological Parents:  Mother, Father (Father: Amy Prince)   Need for Interpreter:  Spanish   Reason for Referral:  Parental Support of Premature Babies < 32 weeks/or Critically Ill babies   Address:  [address] [place] [address]    Phone number:  [phone] (home)     Additional phone number:   Household Members/Support Persons (HM/SP):   Household Member/Support Person 1, Household Member/Support Person 2   HM/SP Name Relationship DOB or Age  HM/SP -1 Iker Prince son 11/25/11  HM/SP -2 Damian [name] son 03/30/19  HM/SP -3        HM/SP -4        HM/SP -5        HM/SP -6        HM/SP -7        HM/SP -8          Natural Supports (not living in the home):  Immediate Family, Spouse/significant other   Professional Supports: None   Employment: Unemployed   Type of Work:     Education:  Other (comment) (8th grade)   Homebound arranged:    Financial Resources:  Self-Pay     Other Resources:  WIC   Cultural/Religious Considerations Which May Impact Care:    Strengths:  Ability to meet basic needs  , Understanding of illness   Psychotropic Medications:         Pediatrician:       Pediatrician List:   [place]    [place]    [place]    [place]    [place]    [place]      Pediatrician Fax Number:    Risk Factors/Current Problems:  None   Cognitive State:  Able to Concentrate  , Alert  , Goal Oriented  , Insightful  , Linear Thinking     Mood/Affect:  Calm  , Comfortable  , Interested     CSW Assessment: CSW met with MOB at bedside to complete psychosocial assessment. CSW utilized [employer] language  services Spanish video interpreter (Francisco #[id number]). CSW introduced self and explained role. CSW offered MOB condolences for loss baby girl (Navany), MOB thanked CSW. MOB reported that she resides with her two older children. MOB reported that she is unemployed and receives WIC. MOB reported that they have just got a few things for infant so far. MOB reported that they will be able to get a car seat and will let CSW know if any assistance is needed with a crib. CSW informed MOB about [employer]'s [employer] if any assistance is needed obtaining items for infant, MOB verbalized understanding. CSW inquired about MOB's support system, MOB reported that her husband and aunt are supports.   CSW inquired about MOB's mental health history. MOB denied any mental health history and denied history of postpartum depression. CSW inquired about how MOB was feeling emotionally since giving birth, MOB reported that she was feeling good. MOB presented calm and did not demonstrate any acute mental health signs/symptoms. CSW assessed for safety, MOB denied SI, HI, and domestic violence.   CSW provided education regarding the baby blues period vs. perinatal mood disorders, discussed treatment and gave   resources for mental health follow up if concerns arise.  CSW recommends self-evaluation during the postpartum time period using the New Mom Checklist from Postpartum Progress and encouraged MOB to contact a medical professional if symptoms are noted at any time.    CSW did not provide a review of Sudden Infant Death Syndrome (SIDS) precautions at this time. SIDS education will be provided prior to discharge.   CSW and MOB discussed infant's NICU admission. CSW informed MOB about the NICU, what to expect, ans resources/supports available while infant is admitted to the NICU. MOB reported that she feels well informed about infant's care and denied any transportation barriers with visiting infant in the NICU.  MOB denied any questions/concerns regarding the NICU. CSW informed MOB that infant qualifies for SSI benefits, MOB reported that she is interested. CSW explained SSI benefits and application process. CSW provided paperwork about SSI benefits.   CSW will continue to offer resources/supports while infant is admitted to the NICU.   CSW Plan/Description:  Perinatal Mood and Anxiety Disorder (PMADs) Education, Psychosocial Support and Ongoing Assessment of Needs, Other Patient/Family Education, Supplemental Security Income (SSI) Information, Other Information/Referral to Community Resources    Pavielle Biggar L Jaretssi Kraker, LCSW 03/11/2021, 10:56 AM 

## 2021-03-11 NOTE — Anesthesia Postprocedure Evaluation (Signed)
Anesthesia Post Note  Patient: Eshika Reckart  Procedure(s) Performed: CESAREAN SECTION (Abdomen)     Patient location during evaluation: PACU Anesthesia Type: Spinal Level of consciousness: oriented and awake and alert Pain management: pain level controlled Vital Signs Assessment: post-procedure vital signs reviewed and stable Respiratory status: spontaneous breathing, respiratory function stable and patient connected to nasal cannula oxygen Cardiovascular status: blood pressure returned to baseline and stable Postop Assessment: no headache, no backache and no apparent nausea or vomiting Anesthetic complications: no   No notable events documented.  Last Vitals:  Vitals:   03/11/21 0805 03/11/21 1146  BP: 123/79 133/64  Pulse: 94 97  Resp: 16 16  Temp: 37.5 C 37.3 C  SpO2: 96% 100%    Last Pain:  Vitals:   03/11/21 1146  TempSrc: Oral  PainSc:    Pain Goal: Patients Stated Pain Goal: 3 (03/10/21 1935)                 Romie Jumper L Lynnita Somma

## 2021-03-12 LAB — TYPE AND SCREEN
ABO/RH(D): O POS
Antibody Screen: POSITIVE
Donor AG Type: NEGATIVE
Unit division: 0
Unit division: 0

## 2021-03-12 LAB — BPAM RBC
Blood Product Expiration Date: 202210152359
Unit Type and Rh: 5100

## 2021-03-13 LAB — TYPE AND SCREEN
ABO/RH(D): O POS
Antibody Screen: POSITIVE

## 2021-03-17 ENCOUNTER — Ambulatory Visit: Payer: Self-pay

## 2021-03-17 NOTE — Lactation Note (Signed)
This note was copied from a baby's chart. Lactation Consultation Note  Patient Name: Amy Prince Today's Date: 03/17/2021 Reason for consult: Follow-up assessment;NICU baby;Multiple gestation;Term;Infant < 6lbs;Other (Comment) (hypoplastic breasts) Age:27 days  Visited with mom of 57 days old pre-term NICU female, she's a P2 and now reports her milk volumes have increased. She's still no pumping consistently. Explained to mom the importance of consistent pumping to protect her supply, she voiced understanding.  Reviewed pumping schedule, power pumping and supply/demand. She picked up her pump from [facility] last week and now has a pump for home use. Mom had questions regarding baby's stay, re-directed to ask those to baby's provider.  Plan of care:   Encouraged mom to start pumping consistently every 2-3 hours, at least 8 pumping sessions/24 hours She'll start power pumping in the AM whenever she misses a pumping session at night   FOB present. All questions and concerns were answered, parents aware of NICU LC services and will call PRN.  Maternal Data   Mom's volume is still BNL but shows improvement  Feeding Mother's Current Feeding Choice: Breast Milk  Lactation Tools Discussed/Used Tools: Pump;Flanges Flange Size: 24 Breast pump type: Double-Electric Breast Pump Pump Education: Setup, frequency, and cleaning;Milk Storage Reason for Pumping: pre-term infant in NICU Pumping frequency: 5 times/24 hours Pumped volume: 30 mL  Interventions Interventions: Breast feeding basics reviewed;DEBP;Education  Discharge Pump: DEBP  Consult Status Consult Status: Follow-up Date: 03/17/21 Follow-up type: In-patient  Isaias Dowson Venetia Constable 03/17/2021, 7:41 PM

## 2021-03-18 ENCOUNTER — Other Ambulatory Visit: Payer: Self-pay

## 2021-03-18 ENCOUNTER — Ambulatory Visit (INDEPENDENT_AMBULATORY_CARE_PROVIDER_SITE_OTHER): Payer: Self-pay

## 2021-03-18 VITALS — BP 132/76 | HR 96 | Wt 226.2 lb

## 2021-03-18 DIAGNOSIS — Z5189 Encounter for other specified aftercare: Secondary | ICD-10-CM

## 2021-03-18 NOTE — Progress Notes (Signed)
Pt here today for wound check. Pt had C-section on 03/09/2021.  Pt denies any drainage, redness or fever to site.   Incision today is clean, dry, intact and well approximated. Pt had honeycomb dressing  that was removed today. Pt tolerated well.   Pt advised to keep clean and dry. Pt verbalized understanding.   Pt has scheduled PP visit on 04/20/2021 at 930am. Pt aware.   Judeth Cornfield, RN

## 2021-03-18 NOTE — Progress Notes (Signed)
Patient was assessed and managed by nursing staff during this encounter. I have reviewed the chart and agree with the documentation and plan.   Jaynie Collins, MD 03/18/2021 1:25 PM

## 2021-03-21 ENCOUNTER — Telehealth (HOSPITAL_COMMUNITY): Payer: Self-pay

## 2021-03-21 NOTE — Telephone Encounter (Signed)
  No answer. Left message to return nurse call.  Marcelino Duster [facility] 03/21/2021,1422

## 2021-03-28 ENCOUNTER — Ambulatory Visit: Payer: Self-pay

## 2021-03-28 NOTE — Lactation Note (Signed)
This note was copied from a baby's chart. 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  Attempted to visit with mom but she wasn't in the room. LC called to F/U with her and she voiced she's still pumping but not consistently. Reviewed strategies to increase supply and power pumping. Mom plans to come to the hospital tonight, will F/U in person.   Maternal Data   Mom's supply is still BNL probably due to risk factors and inconsistent pumping  Feeding Mother's Current Feeding Choice: Breast Milk  Lactation Tools Discussed/Used Tools: Pump Flange Size: 24 Breast pump type: Double-Electric Breast Pump Pump Education: Setup, frequency, and cleaning;Milk Storage Reason for Pumping: pre-term infant in NICU Pumping frequency: 6 times/24 hours Pumped volume: 30 mL  Interventions Interventions: Education  Discharge Pump: DEBP  Consult Status Consult Status: Follow-up Date: 03/28/21 Follow-up type: In-patient   Gillian Kluever Venetia Constable 03/28/2021, 12:36 PM

## 2021-03-28 NOTE — Lactation Note (Signed)
This note was copied from a baby's chart. 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  F/U with mom in baby's room; she told LC that when she pumps every 2 hours she only gets 1/2 of what she normally gets if she waits longer. Explained to mom that it will take at least 5-7 days for her to see results regarding the increase of her pumping sessions.  Breast are soft upon examination (she just pumped) but milk flows easily when doing hand expression. Mom voiced she's only been pumping for 15 minutes at a time, reviewed pumping times and schedule.  Maternal Data   Mom's supply is still BNL probably due to a combination of infrequent pumping and risk factors.  Feeding Mother's Current Feeding Choice: Breast Milk  Lactation Tools Discussed/Used Tools: Pump Flange Size: 24 Breast pump type: Double-Electric Breast Pump Pump Education: Setup, frequency, and cleaning;Milk Storage Reason for Pumping: pre-term infant in NICU Pumping frequency: 6 times/24 hours Pumped volume: 30 mL  Plan of care:   Encouraged mom to start pumping consistently for at least 20 minutes at a time every 2-3 hours, ideally +8 pumping sessions/24 hours She'll make a conscious effort to wake up at night to pump She'll start power pumping in the AM regardless if she did or didn't pump during the night   FOB present. All questions and concerns were answered, parents aware of NICU LC services and will call PRN.  Interventions Interventions: Education  Discharge Pump: DEBP  Consult Status Consult Status: Follow-up Date: 03/28/21 Follow-up type: In-patient   Adesuwa Osgood Venetia Constable 03/28/2021, 3:03 PM

## 2021-04-20 ENCOUNTER — Encounter: Payer: Self-pay | Admitting: Obstetrics and Gynecology

## 2021-04-20 ENCOUNTER — Other Ambulatory Visit: Payer: Self-pay

## 2021-04-20 ENCOUNTER — Ambulatory Visit (INDEPENDENT_AMBULATORY_CARE_PROVIDER_SITE_OTHER): Payer: Self-pay | Admitting: Obstetrics and Gynecology

## 2021-04-20 NOTE — Patient Instructions (Signed)
Health Maintenance, Female Adopting a healthy lifestyle and getting preventive care are important in promoting health and wellness. Ask your health care provider about: The right schedule for you to have regular tests and exams. Things you can do on your own to prevent diseases and keep yourself healthy. What should I know about diet, weight, and exercise? Eat a healthy diet  Eat a diet that includes plenty of vegetables, fruits, low-fat dairy products, and lean protein. Do not eat a lot of foods that are high in solid fats, added sugars, or sodium. Maintain a healthy weight Body mass index (BMI) is used to identify weight problems. It estimates body fat based on height and weight. Your health care provider can help determine your BMI and help you achieve or maintain a healthy weight. Get regular exercise Get regular exercise. This is one of the most important things you can do for your health. Most adults should: Exercise for at least 150 minutes each week. The exercise should increase your heart rate and make you sweat (moderate-intensity exercise). Do strengthening exercises at least twice a week. This is in addition to the moderate-intensity exercise. Spend less time sitting. Even light physical activity can be beneficial. Watch cholesterol and blood lipids Have your blood tested for lipids and cholesterol at 27 years of age, then have this test every 5 years. Have your cholesterol levels checked more often if: Your lipid or cholesterol levels are high. You are older than 27 years of age. You are at high risk for heart disease. What should I know about cancer screening? Depending on your health history and family history, you may need to have cancer screening at various ages. This may include screening for: Breast cancer. Cervical cancer. Colorectal cancer. Skin cancer. Lung cancer. What should I know about heart disease, diabetes, and high blood pressure? Blood pressure and heart  disease High blood pressure causes heart disease and increases the risk of stroke. This is more likely to develop in people who have high blood pressure readings, are of African descent, or are overweight. Have your blood pressure checked: Every 3-5 years if you are 18-39 years of age. Every year if you are 40 years old or older. Diabetes Have regular diabetes screenings. This checks your fasting blood sugar level. Have the screening done: Once every three years after age 40 if you are at a normal weight and have a low risk for diabetes. More often and at a younger age if you are overweight or have a high risk for diabetes. What should I know about preventing infection? Hepatitis B If you have a higher risk for hepatitis B, you should be screened for this virus. Talk with your health care provider to find out if you are at risk for hepatitis B infection. Hepatitis C Testing is recommended for: Everyone born from 1945 through 1965. Anyone with known risk factors for hepatitis C. Sexually transmitted infections (STIs) Get screened for STIs, including gonorrhea and chlamydia, if: You are sexually active and are younger than 27 years of age. You are older than 27 years of age and your health care provider tells you that you are at risk for this type of infection. Your sexual activity has changed since you were last screened, and you are at increased risk for chlamydia or gonorrhea. Ask your health care provider if you are at risk. Ask your health care provider about whether you are at high risk for HIV. Your health care provider may recommend a prescription medicine   to help prevent HIV infection. If you choose to take medicine to prevent HIV, you should first get tested for HIV. You should then be tested every 3 months for as long as you are taking the medicine. Pregnancy If you are about to stop having your period (premenopausal) and you may become pregnant, seek counseling before you get  pregnant. Take 400 to 800 micrograms (mcg) of folic acid every day if you become pregnant. Ask for birth control (contraception) if you want to prevent pregnancy. Osteoporosis and menopause Osteoporosis is a disease in which the bones lose minerals and strength with aging. This can result in bone fractures. If you are 65 years old or older, or if you are at risk for osteoporosis and fractures, ask your health care provider if you should: Be screened for bone loss. Take a calcium or vitamin D supplement to lower your risk of fractures. Be given hormone replacement therapy (HRT) to treat symptoms of menopause. Follow these instructions at home: Lifestyle Do not use any products that contain nicotine or tobacco, such as cigarettes, e-cigarettes, and chewing tobacco. If you need help quitting, ask your health care provider. Do not use street drugs. Do not share needles. Ask your health care provider for help if you need support or information about quitting drugs. Alcohol use Do not drink alcohol if: Your health care provider tells you not to drink. You are pregnant, may be pregnant, or are planning to become pregnant. If you drink alcohol: Limit how much you use to 0-1 drink a day. Limit intake if you are breastfeeding. Be aware of how much alcohol is in your drink. In the [place] one drink equals one 12 oz bottle of beer (355 mL), one 5 oz glass of wine (148 mL), or one 1 oz glass of hard liquor (44 mL). General instructions Schedule regular health, dental, and eye exams. Stay current with your vaccines. Tell your health care provider if: You often feel depressed. You have ever been abused or do not feel safe at home. Summary Adopting a healthy lifestyle and getting preventive care are important in promoting health and wellness. Follow your health care provider's instructions about healthy diet, exercising, and getting tested or screened for diseases. Follow your health care provider's  instructions on monitoring your cholesterol and blood pressure. This information is not intended to replace advice given to you by your health care provider. Make sure you discuss any questions you have with your health care provider. Document Revised: 08/15/2020 Document Reviewed: 05/31/2018 Elsevier Patient Education  2022 Elsevier Inc.  

## 2021-04-20 NOTE — Progress Notes (Signed)
    Post Partum Visit Note  Amy Prince is a 25 y.o. [id number] female who presents for a postpartum visit. She is 6 weeks postpartum following a primary cesarean section.  I have fully reviewed the prenatal and intrapartum course. The delivery was at 26/6 gestational weeks due to twin gestation and fetal hydrops. Anesthesia: spinal. Postpartum course has been uncomplicated. Babies are in NICU. Babies are feeding by  Breast milk in bottle . Bleeding staining only. Bowel function is normal. Bladder function is normal. Patient is not sexually active. Contraception method is none. Postpartum depression screening: negative.   The pregnancy intention screening data noted above was reviewed. Potential methods of contraception were discussed. The patient elected to proceed with No data recorded.    Health Maintenance Due  Topic Date Due   COVID-19 Vaccine (1) Never done   Hepatitis C Screening  Never done   INFLUENZA VACCINE  01/19/2021    Medical record  Review of Systems Pertinent items noted in HPI and remainder of comprehensive ROS otherwise negative.  Objective:  There were no vitals taken for this visit.   General:  alert   Breasts:  not indicated  Lungs: clear to auscultation bilaterally  Heart:  regular rate and rhythm, S1, S2 normal, no murmur, click, rub or gallop  Abdomen: soft, non-tender; bowel sounds normal; no masses,  no organomegaly   Wound Small area of drainage left conner, cleaned, drag applied  GU exam:  not indicated       Assessment:    There are no diagnoses linked to this encounter.  Nl postpartum exam.  Wound care [facility]UD placement at [facility]  Plan:   Essential components of care per ACOG recommendations:  1.  Mood and well being: Patient with negative depression screening today. Reviewed local resources for support.  - Patient tobacco use? No.   - hx of drug use? No.    2. Infant care and feeding:  -Patient currently breastmilk feeding? Yes.  Reviewed importance of draining breast regularly to support lactation.  -Social determinants of health (SDOH) reviewed in EPIC. No concerns  3. Sexuality, contraception and birth spacing - Patient does not want a pregnancy in the next year.  Desired family size is uncertain  - Reviewed forms of contraception in tiered fashion. Patient desired IUD, will have [facility] at [facility] - Discussed birth spacing of 18 months  4. Sleep and fatigue -Encouraged family/partner/community support of 4 hrs of uninterrupted sleep to help with mood and fatigue  5. Physical Recovery  - Discussed patients delivery and complications. She describes her labor as good. - Patient had a C-section.  Patient expressed understanding - Patient has urinary incontinence? No. - Patient is safe to resume physical and sexual activity  6.  Health Maintenance - HM due items addressed Yes - Last pap smear No results found for: DIAGPAP Pap smear not done at today's visit.  -Breast Cancer screening indicated? No.   7. Chronic Disease/Pregnancy Condition follow up: None  - PCP follow up  Nettie Elm, MD [facility], [facility]

## 2021-04-20 NOTE — Progress Notes (Signed)
Pt states has been having a lot of cramping pain in abdomen. Pty states on left side of incision has been having some burning & itching & redness.

## 2021-05-22 ENCOUNTER — Ambulatory Visit: Payer: Self-pay

## 2021-05-22 NOTE — Lactation Note (Signed)
This note was copied from a baby's chart. Lactation Consultation Note Infant has returned from [place]. Per RN, mother continues to pump but breastfeeding status is unknown. Mother is not present at this time. RN to notify [facility] of mom's arrival. [facility] will plan f/u visit and update plan of care.  Patient Name: Amy Prince Today's Date: 05/22/2021   Age:18 m.o.  Feeding Nipple Type: Nfant Extra Slow Flow (gold)    Elder Negus 05/22/2021, 7:57 AM

## 2021-05-23 ENCOUNTER — Ambulatory Visit: Payer: Self-pay

## 2021-05-23 NOTE — Lactation Note (Signed)
This note was copied from a baby's chart. Lactation Consultation Note  Patient Name: Amy Prince Amy Prince Date: 05/23/2021 Reason for consult: Follow-up assessment;NICU baby;Early term 37-38.6wks;Infant < 6lbs;Other (Comment) (returning from [place], PDA on 04/08/21) Age:27 m.o.  Visited with mom of 76 40/24 weeks old (adjusted) NICU female, baby returned from [facility] yesterday after having PDA surgery. Mom voiced her supply is dwindling, she's unsure if her period came in since she's having vaginal bleeding for 55 days post-partum.  She said she checked with her OBGYN and was told everything was normal. She also got some Fenugreek from [facility] but only took two capsules. Reviewed with mom some strategies to increase her supply, power pumping, pumping schedule and supply/demand.  Maternal Data  Mom's supply is BNL possibly due to decreased pumping  Feeding Mother's Current Feeding Choice: Breast Milk and Formula Nipple Type: Nfant Extra Slow Flow (gold)  Lactation Tools Discussed/Used Tools: Pump Breast pump type: Double-Electric Breast Pump Pump Education: Setup, frequency, and cleaning;Milk Storage Reason for Pumping: ETI in NICU Pumping frequency: 4-5 times/24 hours Pumped volume: 90 mL (90-140 ml)  Interventions Interventions: Breast feeding basics reviewed;Education;DEBP  Plan of care  Encouraged mom to start pumping consistently every 3 hours, ideally 8 pumping sessions/24 hours She'll start power pumping every day in the AM. Mom aware that it will take at least 5-7 days to see results from any intervention  No other support person at this time. All questions and concerns answered, mom to call NICU LC PRN.  Discharge Pump: DEBP;Personal (Mom has a pump from [facility] at home)  Consult Status Consult Status: Follow-up Date: 05/23/21 Follow-up type: In-patient   Amy Prince 05/23/2021, 2:56 PM

## 2021-05-27 ENCOUNTER — Ambulatory Visit: Payer: Self-pay

## 2021-05-27 NOTE — Lactation Note (Signed)
This note was copied from a baby's chart. Lactation Consultation Note  Patient Name: Amy Prince Today's Date: 05/27/2021 Reason for consult: NICU baby;Early term 37-38.6wks;Other (Comment);Follow-up assessment;Infant < 6lbs (returning from [place], PDA on 10/19) Age:27 m.o.  Visited with mom of 10 4/32 weeks old (adjusted) NICU female, mom voiced that she's working on getting her supply back, she hasn't started taking the Fenugreek supplement yet, but she has started power pumping and pumping more often.  Mom's plan is to pump and bottle feed for her baby, she's not planning on taking her to breast. Reviewed strategies to increase her supply and answered several questions regarding galactagogues including fenugreek dose to increase milk supply (2-4 capsules x 3 times/daily).  Plan of care   Encouraged mom to continue pumping consistently every 3 hours, ideally 8 pumping sessions/24 hours She'll continue power pumping every day in the AM   No other support person at this time. All questions and concerns answered, mom to call NICU LC PRN.  Maternal Data  Mom's supply has decreased, she voiced she's getting "less" because she's pumping more often now  Feeding Mother's Current Feeding Choice: Breast Milk and Formula Nipple Type: Dr. Levert Feinstein Preemie  Lactation Tools Discussed/Used Tools: Pump Breast pump type: Double-Electric Breast Pump Pump Education: Setup, frequency, and cleaning;Milk Storage Reason for Pumping: ETI in NICU Pumping frequency: 6-7 times/24 hours Pumped volume: 30 mL (30-60 ml)  Interventions Interventions: Breast feeding basics reviewed;DEBP;Education  Discharge Pump: DEBP;Personal (Mom has a pump from [facility] at home)  Consult Status Consult Status: Follow-up Date: 05/27/21 Follow-up type: In-patient   Amy Prince 05/27/2021, 6:34 PM

## 2021-06-02 ENCOUNTER — Ambulatory Visit: Payer: Self-pay

## 2021-06-02 NOTE — Lactation Note (Signed)
This note was copied from a baby's chart. Lactation Consultation Note  Patient Name: Amy Prince Date: 06/02/2021 Reason for consult: Follow-up assessment;NICU baby;Term;Infant < 6lbs Age:27 m.o.  Visited with mom of 19 75/41 weeks old (adjusted) NICU female, she reports that BF interventions are working out, she has seeing her supply increasing but she's still not pumping 8 times/24 hours. Mom is aware that this is a critical piece of the plan and she's willing to continue working on it.  She started taking her fenugreek capsules last week and plans to continuing doing so. She has also increased her water intake most days. Reviewed pumping schedule, power pumping, supply/demand and galactagogues, no need for latch assistance at this point since mom's plan is to pump and bottle feed only.  Maternal Data  Mom's supply has slightly increased but is still BNL, she's willing to continue working on it though  Feeding Mother's Current Feeding Choice: Breast Milk and Formula Nipple Type: Dr. Levert Feinstein Preemie  Lactation Tools Discussed/Used Tools: Pump Breast pump type: Double-Electric Breast Pump Pump Education: Setup, frequency, and cleaning;Milk Storage Reason for Pumping: NICU infant Pumping frequency: 6 times/24 hours Pumped volume: 45 mL (45-60 ml)  Interventions Interventions: Breast feeding basics reviewed;DEBP;Education  Plan of care   Encouraged mom to continue pumping consistently every 3 hours, ideally 8 pumping sessions/24 hours She'll continue power pumping every day in the AM and continue taking fenugreek   No other support person at this time. All questions and concerns answered, mom to call NICU LC PRN.  Discharge Pump: DEBP;Personal  Consult Status Consult Status: Follow-up Date: 06/02/21 Follow-up type: In-patient   Amy Prince 06/02/2021, 5:07 PM

## 2021-06-13 ENCOUNTER — Ambulatory Visit: Payer: Self-pay

## 2021-06-13 NOTE — Lactation Note (Signed)
This note was copied from a baby's chart. Lactation Consultation Note  Patient Name: [facility] Lonie Peak [id number] Date: 06/13/2021 Reason for consult: Follow-up assessment;NICU baby;Term;Other (Comment) (PDA 10/19 @ [facility]) Age:27 m.o.  Visited with mom of 94 67/59 weeks old NICU female, she's a P3 and reports that the Fenugreek supplement she took for 5 days to increase her supply did not work out. Explained to mom that galactagogues are tricky and not warranted to increase supply unlike pumping consistently at least 8 times/24 hours.  Mom voiced that sometimes she pumps at shorter intervals during the day but that she gets less milk when she does that. However the amounts that she's currently getting are within  her goals, her plan is to exclusively pumping and bottle breastmilk/formula to baby "[place]".  Maternal Data  Mom's supply is BNL  Feeding Mother's Current Feeding Choice: Breast Milk and Formula Nipple Type: Dr. Levert Feinstein Preemie  Lactation Tools Discussed/Used Tools: Pump Breast pump type: Double-Electric Breast Pump Pump Education: Setup, frequency, and cleaning;Milk Storage Reason for Pumping: NICU infant Pumping frequency: 6 times/24 hours Pumped volume: 30 mL (30-60 ml)  Interventions Interventions: Breast feeding basics reviewed;DEBP;Education  Plan of care   Encouraged mom to continue pumping consistently at her own pace  She'll continue power pumping in the AM or pump for longer   No other support person at this time. All questions and concerns answered, mom to call NICU LC PRN.  Discharge Pump: DEBP;Personal  Consult Status Consult Status: Follow-up Date: 06/13/21 Follow-up type: In-patient   Alba Kriesel Venetia Constable 06/13/2021, 1:34 PM

## 2021-06-18 ENCOUNTER — Ambulatory Visit: Payer: Self-pay

## 2021-06-18 NOTE — Lactation Note (Signed)
This note was copied from a baby's chart.  NICU Lactation Consultation Note  Patient Name: [facility] Lonie Peak Today's Date: 06/18/2021 Age:27 m.o.   Subjective Reason for consult: Follow-up assessment Mother continues to pump often. She plans to bottle feed only.   Objective Infant data: Mother's Current Feeding Choice: Breast Milk and Formula  Infant feeding assessment Scale for Readiness: 1 Scale for Quality: 3     Maternal data: [id number]  C-Section, Low Transverse Pumping frequency: 69mL - 12mL 6 - 7 x day  Pump: DEBP, Personal  Assessment Maternal: Milk volume: Low   Intervention/Plan Interventions: Education  Tools: Other (comment) (interpreter services)  Plan: Consult Status: Follow-up  NICU Follow-up type: Weekly NICU follow up  Mother to continue pumping q3  Elder Negus 06/18/2021, 2:50 PM

## 2021-06-27 ENCOUNTER — Ambulatory Visit: Payer: Self-pay

## 2021-06-27 NOTE — Lactation Note (Signed)
This note was copied from a baby's chart. Lactation Consultation Note  Patient Name: [facility] Lonie Peak [id number] Date: 06/27/2021 Reason for consult: Follow-up assessment;NICU baby;Term;Other (Comment) (baby's discharge) Age:28 m.o.  Visited with mom of 77 10/69 weeks old (adjusted) NICU female, she's taking baby home today. Reviewed discharge education, pumping schedule and strategies to increase her supply.   LC OP F/U was offered and sent out to [employer]. Mom would like to continue working on increasing her supply before she gives up. Her last period was on Dec. 22nd and she feels like her supply has not picked up since then.   She tried power pumping for a week but since she didn't see results, she quit power pumping and now just pumps for longer in the AM when she skips the 2 pumping sessions at night (8-9 hours).   She understand the importance of consistent pumping to protect her supply but has two other kids at home and keeping up with pumping has been challenging for her. Baby currently on breastmilk/Similac 27 calorie formula and going home on breastmilk/Similac 22 calorie formula.   Encouraged to start pumping consistently ideally 8 times/24 hours and to power pump in the AM or pump for longer. Advised not going more than 6 hours without pumping at night. All questions and concerns answered, mom to contact NICU LC PRN.  Maternal Data  Mom's supply continues to dwindle and is [employer] Mother's Current Feeding Choice: Breast Milk and Formula  Lactation Tools Discussed/Used Tools: Pump Breast pump type: Double-Electric Breast Pump Pump Education: Setup, frequency, and cleaning;Milk Storage Reason for Pumping: NICU infant Pumping frequency: 6 times/24 hours Pumped volume: 32 mL  Interventions Interventions: Breast feeding basics reviewed;DEBP;Education  Discharge Discharge Education: Outpatient recommendation;Outpatient Epic message sent Pump: DEBP;Personal  [facility] pump)  Consult Status Consult Status: Complete Date: 06/27/21 Follow-up type: Call as needed   Mehar Kirkwood Venetia Constable 06/27/2021, 12:47 PM

## 2022-08-30 IMAGING — US US MFM OB LIMITED
1 series · 14 of 28 positions shown · non-contrast
Comparison: none

[Series 1: us mfm ob limited · 14 of 32 slices shown]
[im 2/32]
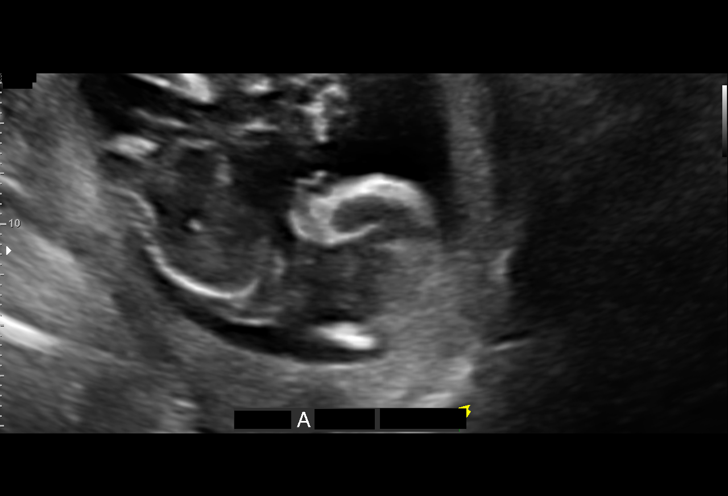
[im 4/32]
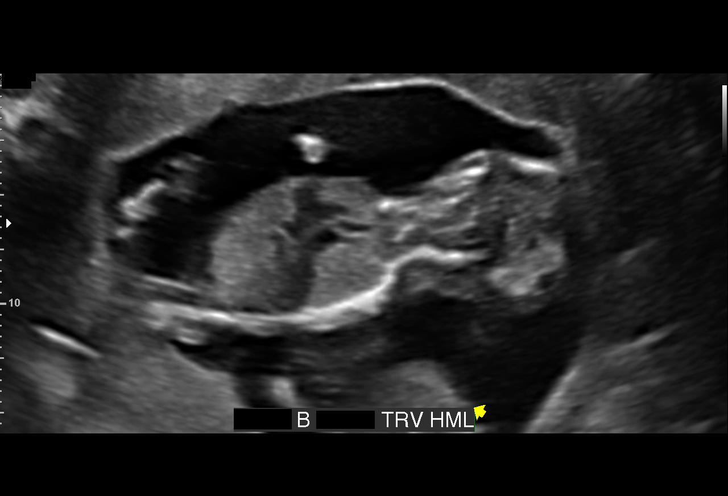
[im 6/32]
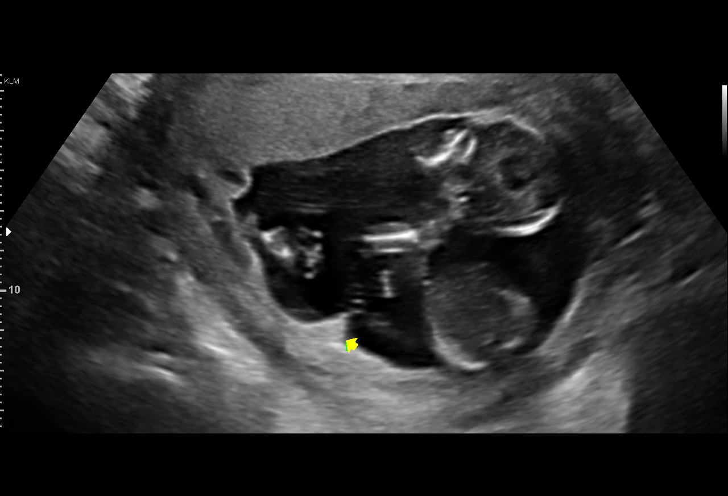
[im 9/32]
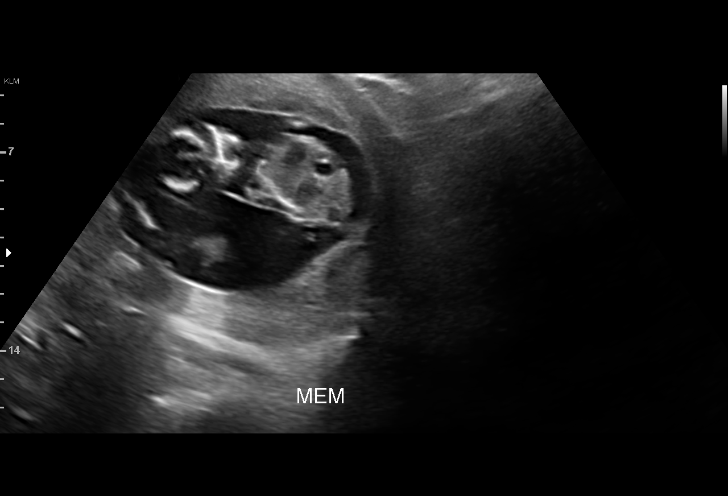
[im 11/32]
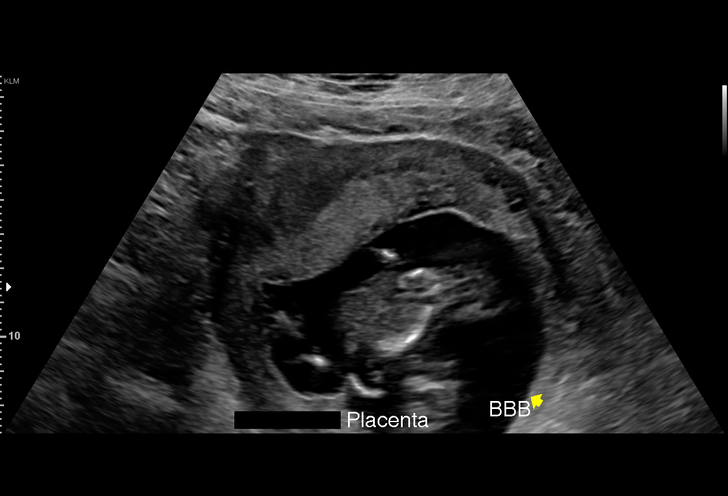
[im 13/32]
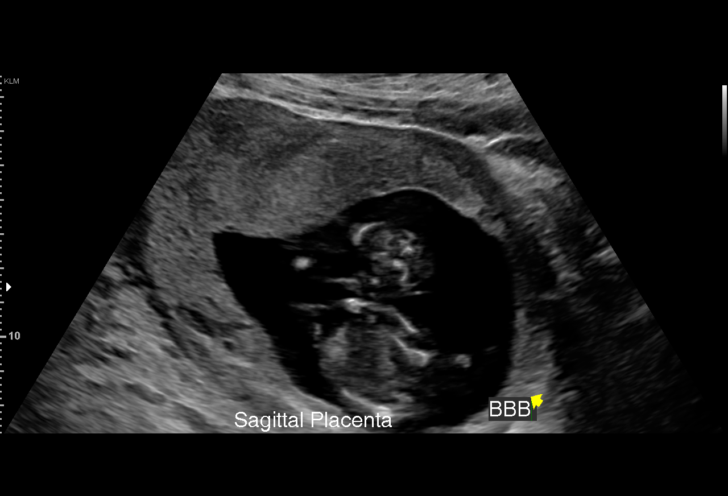
[im 15/32]
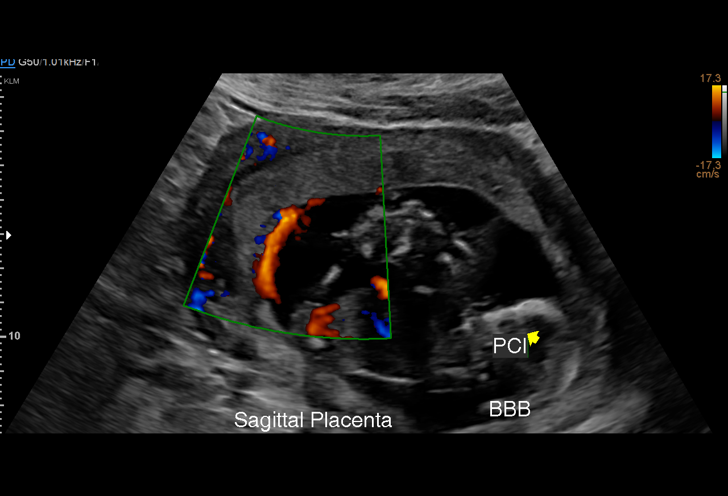
[im 18/32]
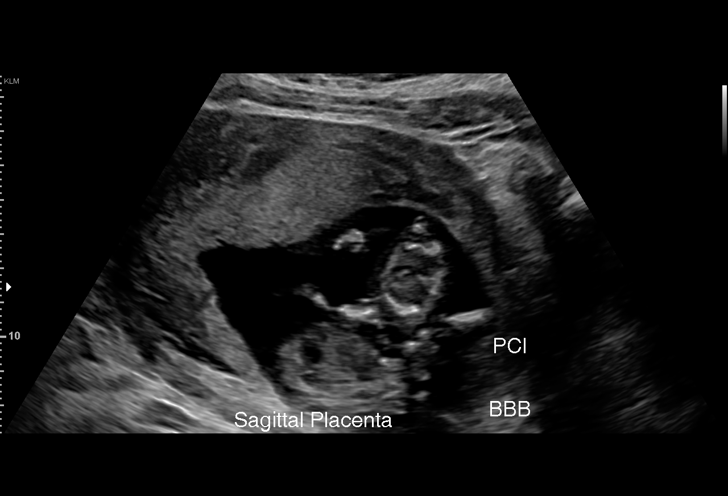
[im 20/32]
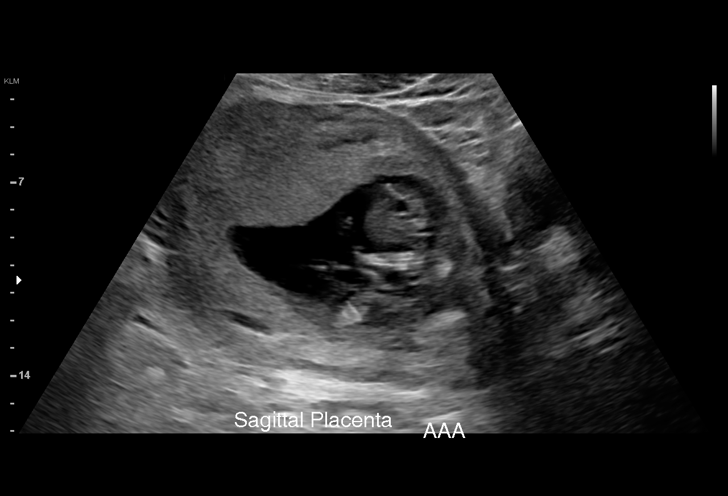
[im 22/32]
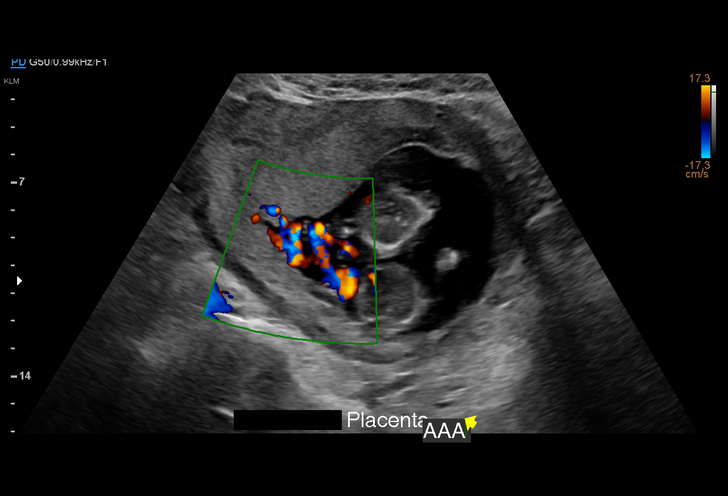
[im 25/32]
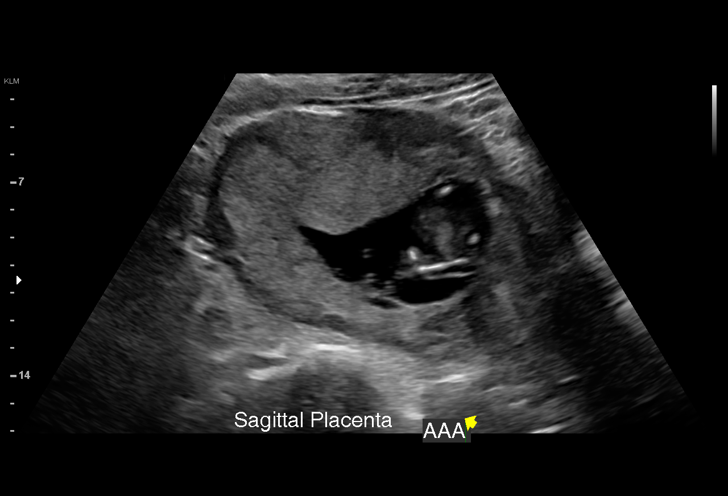
[im 27/32]
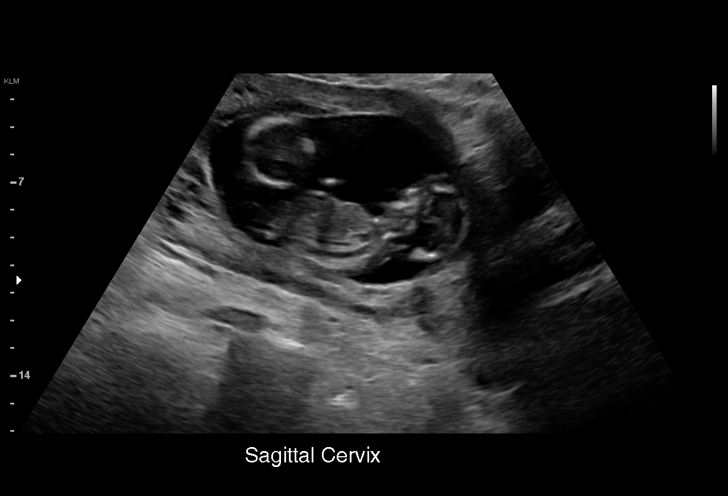
[im 29/32]
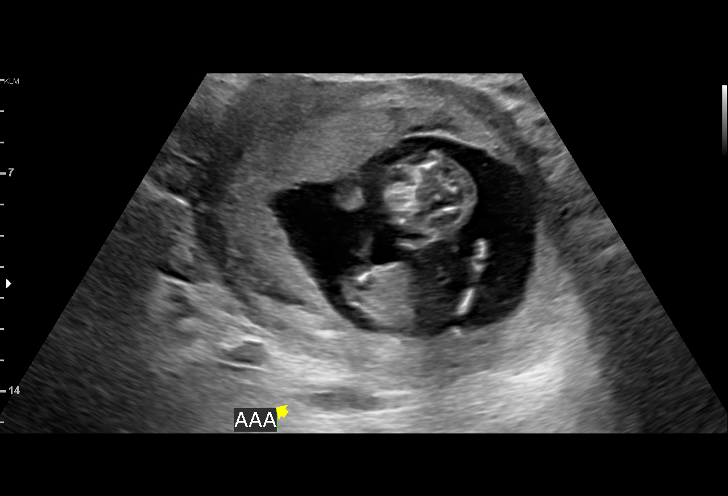
[im 32/32]
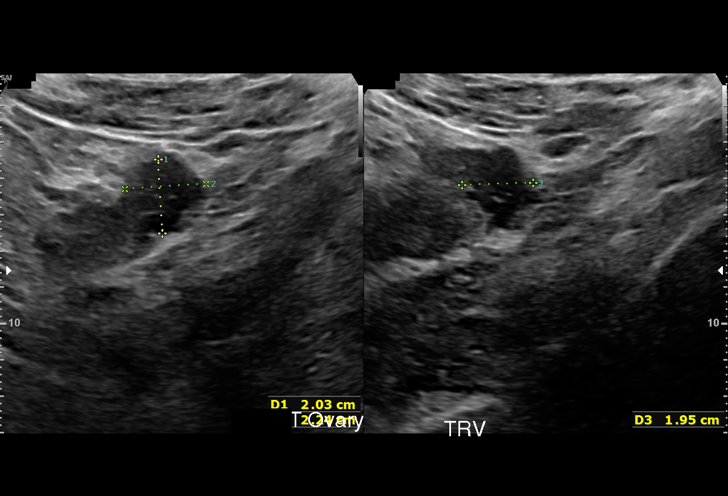

[14 of 28 positions shown; findings below may reference images not displayed]

[REDACTED]
                   NAAHY ABDUL CNM

Indications

 Vaginal bleeding in pregnancy, second
 trimester
 16 weeks gestation of pregnancy
 Twin pregnancy, di/di, second trimester
Fetal Evaluation (Fetus A)

 Num Of Fetuses:         2
 Fetal Heart Rate(bpm):  176
 Cardiac Activity:       Observed
 Fetal Lie:              Lower Fetus
 Presentation:           Cephalic
 Placenta:               Posterior
 P. Cord Insertion:      Visualized
 Membrane Desc:      Dividing Membrane seen - Dichorionic.

 Amniotic Fluid
 AFI FV:      Within normal limits
OB History

 Gravidity:    4         Term:   2        Prem:   1        SAB:   1
 TOP:          0       Ectopic:  0        Living: 2
Gestational Age (Fetus A)

 LMP:           16w 3d        Date:  08/19/20                 EDD:   05/26/21
 Best:          16w 3d     Det. By:  LMP  (08/19/20)          EDD:   05/26/21
Fetal Evaluation (Fetus B)
 Num Of Fetuses:         2
 Fetal Heart Rate(bpm):  157
 Cardiac Activity:       Observed
 Fetal Lie:              Upper Fetus
 Presentation:           Transverse, head to maternal left
 Placenta:               Anterior
 P. Cord Insertion:      Visualized
 Membrane Desc:      Dividing Membrane seen - Dichorionic.

 Amniotic Fluid
 AFI FV:      Within normal limits

 Comment:    No placental previa identified.
Gestational Age (Fetus B)

 LMP:           16w 3d        Date:  08/19/20                 EDD:   05/26/21
 Best:          16w 3d     Det. By:  LMP  (08/19/20)          EDD:   05/26/21
Cervix Uterus Adnexa

 Cervix
 Length:            4.4  cm.
 Closed Normal appearance by transabdominal scan.

 Uterus
 No abnormality visualized.

 Right Ovary
 Within normal limits.

 Left Ovary
 Within normal limits.

 Cul De Sac
 No free fluid seen.

 Adnexa
 No abnormality visualized.
Impression

 Limited exam to assess early twin pregnancy
 Good fetal movement and amniotic fluid volume
 Suspected twin peak sign and thickened membrane.

 However, given the suboptimal nature of today's imaging I
 recommend repeat exam for chroionicity confirmation with
 MFM.
Recommendations

 Follow up at first avaialble with MFM
 Consider cell free DNA for confirmation.
